# Patient Record
Sex: Male | Born: 1955 | Race: White | Hispanic: No | Marital: Married | Smoking: Former smoker
Health system: Southern US, Community
[De-identification: ages and names within clinical notes are randomized; demographics above are authoritative.]

## PROBLEM LIST (undated history)

## (undated) DIAGNOSIS — Z87442 Personal history of urinary calculi: Secondary | ICD-10-CM

## (undated) DIAGNOSIS — R7302 Impaired glucose tolerance (oral): Secondary | ICD-10-CM

## (undated) DIAGNOSIS — D126 Benign neoplasm of colon, unspecified: Secondary | ICD-10-CM

## (undated) DIAGNOSIS — K222 Esophageal obstruction: Secondary | ICD-10-CM

## (undated) DIAGNOSIS — K279 Peptic ulcer, site unspecified, unspecified as acute or chronic, without hemorrhage or perforation: Secondary | ICD-10-CM

## (undated) DIAGNOSIS — F528 Other sexual dysfunction not due to a substance or known physiological condition: Secondary | ICD-10-CM

## (undated) DIAGNOSIS — E785 Hyperlipidemia, unspecified: Secondary | ICD-10-CM

## (undated) DIAGNOSIS — K2270 Barrett's esophagus without dysplasia: Secondary | ICD-10-CM

## (undated) DIAGNOSIS — J309 Allergic rhinitis, unspecified: Secondary | ICD-10-CM

## (undated) HISTORY — DX: Barrett's esophagus without dysplasia: K22.70

## (undated) HISTORY — DX: Benign neoplasm of colon, unspecified: D12.6

## (undated) HISTORY — DX: Other sexual dysfunction not due to a substance or known physiological condition: F52.8

## (undated) HISTORY — DX: Peptic ulcer, site unspecified, unspecified as acute or chronic, without hemorrhage or perforation: K27.9

## (undated) HISTORY — DX: Allergic rhinitis, unspecified: J30.9

## (undated) HISTORY — DX: Personal history of urinary calculi: Z87.442

## (undated) HISTORY — PX: COLONOSCOPY: SHX174

## (undated) HISTORY — PX: SMALL INTESTINE SURGERY: SHX150

## (undated) HISTORY — DX: Hyperlipidemia, unspecified: E78.5

## (undated) HISTORY — PX: CHOLECYSTECTOMY: SHX55

## (undated) HISTORY — DX: Impaired glucose tolerance (oral): R73.02

## (undated) HISTORY — DX: Esophageal obstruction: K22.2

## (undated) HISTORY — PX: POLYPECTOMY: SHX149

---

## 2005-02-21 ENCOUNTER — Ambulatory Visit: Payer: Self-pay | Admitting: Internal Medicine

## 2005-03-06 DIAGNOSIS — D126 Benign neoplasm of colon, unspecified: Secondary | ICD-10-CM

## 2005-03-06 HISTORY — DX: Benign neoplasm of colon, unspecified: D12.6

## 2005-03-08 ENCOUNTER — Ambulatory Visit: Payer: Self-pay | Admitting: Internal Medicine

## 2005-04-20 ENCOUNTER — Ambulatory Visit: Payer: Self-pay | Admitting: Gastroenterology

## 2005-05-02 ENCOUNTER — Encounter (INDEPENDENT_AMBULATORY_CARE_PROVIDER_SITE_OTHER): Payer: Self-pay | Admitting: *Deleted

## 2005-05-02 ENCOUNTER — Ambulatory Visit: Payer: Self-pay | Admitting: Gastroenterology

## 2006-07-31 ENCOUNTER — Ambulatory Visit: Payer: Self-pay | Admitting: Internal Medicine

## 2006-07-31 LAB — CONVERTED CEMR LAB
ALT: 28 units/L (ref 0–40)
Basophils Absolute: 0 10*3/uL (ref 0.0–0.1)
Bilirubin Urine: NEGATIVE
Bilirubin, Direct: 0.1 mg/dL (ref 0.0–0.3)
Calcium: 8.9 mg/dL (ref 8.4–10.5)
Cholesterol: 157 mg/dL (ref 0–200)
Eosinophils Absolute: 0.2 10*3/uL (ref 0.0–0.6)
Eosinophils Relative: 1.9 % (ref 0.0–5.0)
GFR calc Af Amer: 101 mL/min
GFR calc non Af Amer: 83 mL/min
Glucose, Bld: 198 mg/dL — ABNORMAL HIGH (ref 70–99)
HDL: 43.6 mg/dL (ref >39.0)
Hemoglobin, Urine: NEGATIVE
LDL Cholesterol: 101 mg/dL — ABNORMAL HIGH (ref 0–99)
Lymphocytes Relative: 21.1 % (ref 12.0–46.0)
MCHC: 34.7 g/dL (ref 30.0–36.0)
MCV: 92.3 fL (ref 78.0–100.0)
Monocytes Relative: 6.9 % (ref 3.0–11.0)
Neutro Abs: 6.5 10*3/uL (ref 1.4–7.7)
PSA: 0.89 ng/mL (ref 0.10–4.00)
Platelets: 268 10*3/uL (ref 150–400)
Total CHOL/HDL Ratio: 3.6
Triglycerides: 64 mg/dL (ref 0–149)
Urine Glucose: NEGATIVE mg/dL
WBC: 9.2 10*3/uL (ref 4.5–10.5)

## 2006-08-09 ENCOUNTER — Ambulatory Visit: Payer: Self-pay | Admitting: Internal Medicine

## 2007-01-24 ENCOUNTER — Telehealth (INDEPENDENT_AMBULATORY_CARE_PROVIDER_SITE_OTHER): Payer: Self-pay | Admitting: *Deleted

## 2007-01-25 ENCOUNTER — Ambulatory Visit: Payer: Self-pay | Admitting: Internal Medicine

## 2007-01-25 DIAGNOSIS — J309 Allergic rhinitis, unspecified: Secondary | ICD-10-CM

## 2007-01-25 DIAGNOSIS — R7302 Impaired glucose tolerance (oral): Secondary | ICD-10-CM

## 2007-01-25 DIAGNOSIS — E785 Hyperlipidemia, unspecified: Secondary | ICD-10-CM | POA: Insufficient documentation

## 2007-01-25 DIAGNOSIS — R310 Gross hematuria: Secondary | ICD-10-CM | POA: Insufficient documentation

## 2007-01-25 DIAGNOSIS — F528 Other sexual dysfunction not due to a substance or known physiological condition: Secondary | ICD-10-CM | POA: Insufficient documentation

## 2007-01-25 DIAGNOSIS — Z860100 Personal history of colon polyps, unspecified: Secondary | ICD-10-CM | POA: Insufficient documentation

## 2007-01-25 HISTORY — DX: Impaired glucose tolerance (oral): R73.02

## 2007-01-25 HISTORY — DX: Hyperlipidemia, unspecified: E78.5

## 2007-01-25 HISTORY — DX: Other sexual dysfunction not due to a substance or known physiological condition: F52.8

## 2007-01-25 HISTORY — DX: Allergic rhinitis, unspecified: J30.9

## 2007-01-25 LAB — CONVERTED CEMR LAB
Bilirubin Urine: NEGATIVE
Glucose, Urine, Semiquant: NEGATIVE
Protein, U semiquant: NEGATIVE
pH: 6.5

## 2007-01-28 LAB — CONVERTED CEMR LAB
Bilirubin Urine: NEGATIVE
GFR calc non Af Amer: 94 mL/min
Potassium: 4.2 meq/L (ref 3.5–5.1)
Sodium: 141 meq/L (ref 135–145)
Specific Gravity, Urine: 1.02 (ref 1.000–1.03)
Total Protein, Urine: NEGATIVE mg/dL
Urine Glucose: NEGATIVE mg/dL
pH: 6 (ref 5.0–8.0)

## 2007-04-17 ENCOUNTER — Emergency Department (HOSPITAL_COMMUNITY): Admission: EM | Admit: 2007-04-17 | Discharge: 2007-04-17 | Payer: Self-pay | Admitting: Emergency Medicine

## 2007-04-17 ENCOUNTER — Telehealth (INDEPENDENT_AMBULATORY_CARE_PROVIDER_SITE_OTHER): Payer: Self-pay | Admitting: *Deleted

## 2007-06-06 ENCOUNTER — Encounter (INDEPENDENT_AMBULATORY_CARE_PROVIDER_SITE_OTHER): Payer: Self-pay | Admitting: General Surgery

## 2007-06-06 ENCOUNTER — Ambulatory Visit (HOSPITAL_COMMUNITY): Admission: RE | Admit: 2007-06-06 | Discharge: 2007-06-06 | Payer: Self-pay | Admitting: General Surgery

## 2007-07-05 ENCOUNTER — Encounter: Admission: RE | Admit: 2007-07-05 | Discharge: 2007-07-05 | Payer: Self-pay | Admitting: General Surgery

## 2008-06-16 ENCOUNTER — Ambulatory Visit: Payer: Self-pay | Admitting: Internal Medicine

## 2008-06-16 LAB — CONVERTED CEMR LAB
ALT: 28 units/L (ref 0–53)
AST: 24 units/L (ref 0–37)
Alkaline Phosphatase: 57 units/L (ref 39–117)
Basophils Absolute: 0.1 10*3/uL (ref 0.0–0.1)
Bilirubin Urine: NEGATIVE
Calcium: 8.8 mg/dL (ref 8.4–10.5)
Eosinophils Absolute: 0.5 10*3/uL (ref 0.0–0.7)
Eosinophils Relative: 8.4 % — ABNORMAL HIGH (ref 0.0–5.0)
GFR calc non Af Amer: 82.47 mL/min (ref >60)
Glucose, Bld: 110 mg/dL — ABNORMAL HIGH (ref 70–99)
HDL: 37.4 mg/dL — ABNORMAL LOW (ref >39.00)
Hemoglobin, Urine: NEGATIVE
Ketones, ur: NEGATIVE mg/dL
Leukocytes, UA: NEGATIVE
MCV: 91.1 fL (ref 78.0–100.0)
Monocytes Absolute: 0.7 10*3/uL (ref 0.1–1.0)
Neutrophils Relative %: 51.7 % (ref 43.0–77.0)
PSA: 0.62 ng/mL (ref 0.10–4.00)
Platelets: 218 10*3/uL (ref 150.0–400.0)
RDW: 12.5 % (ref 11.5–14.6)
Sodium: 144 meq/L (ref 135–145)
Specific Gravity, Urine: 1.01 (ref 1.000–1.030)
TSH: 1.52 microintl units/mL (ref 0.35–5.50)
Total Bilirubin: 0.6 mg/dL (ref 0.3–1.2)
Triglycerides: 71 mg/dL (ref 0.0–149.0)
Urine Glucose: NEGATIVE mg/dL
Urobilinogen, UA: 0.2 (ref 0.0–1.0)
WBC: 6.1 10*3/uL (ref 4.5–10.5)

## 2008-06-23 ENCOUNTER — Ambulatory Visit: Payer: Self-pay | Admitting: Internal Medicine

## 2008-06-23 DIAGNOSIS — Z87442 Personal history of urinary calculi: Secondary | ICD-10-CM

## 2008-06-23 DIAGNOSIS — J069 Acute upper respiratory infection, unspecified: Secondary | ICD-10-CM | POA: Insufficient documentation

## 2008-06-23 HISTORY — DX: Personal history of urinary calculi: Z87.442

## 2008-07-22 IMAGING — CT CT CHEST W/ CM
2 of 3 series · 15 of 36 positions shown, 18 images · IV contrast (75CC OMNI 300)
Comparison: None.  The chest x-ray in question has been reviewed.

CLINICAL DATA: Left lung nodules seen on a recent chest x-ray from
06/04/2007

CT CHEST WITH CONTRAST
TECHNIQUE: Multidetector CT imaging of the chest was performed
following the standard protocol during bolus administration of
intravenous contrast.
Contrast: 75 ml Tmnipaque-VUU

[Series 3: routine chest · axial · 0.70mm/px · z∈[-297,+13]mm · 12 of 74 slices shown, 15 images]
[im 6/74  mediastinal]
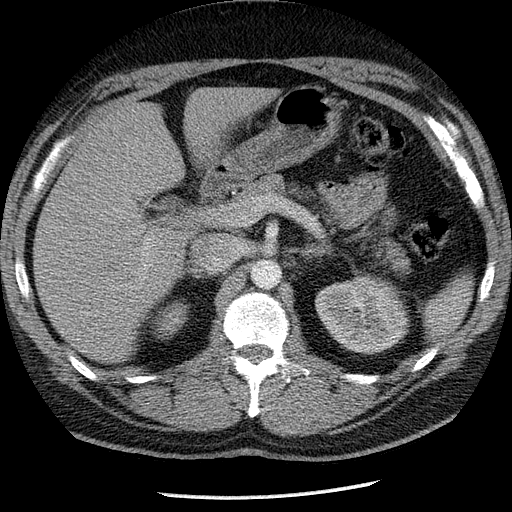
[im 6/74  lung]
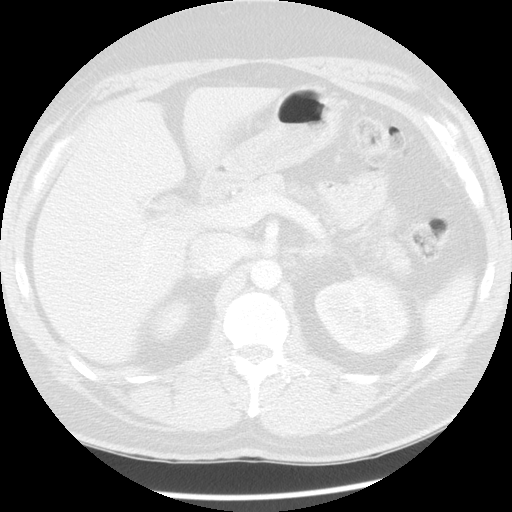
[im 11/74  lung]
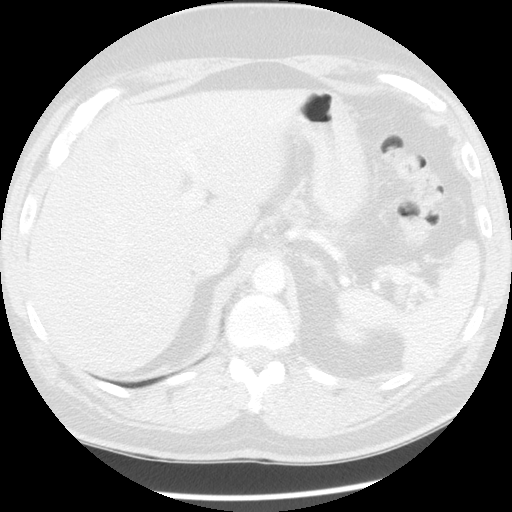
[im 17/74  lung]
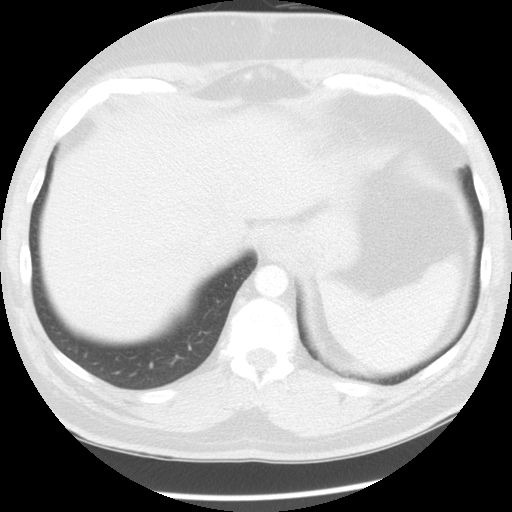
[im 22/74  lung]
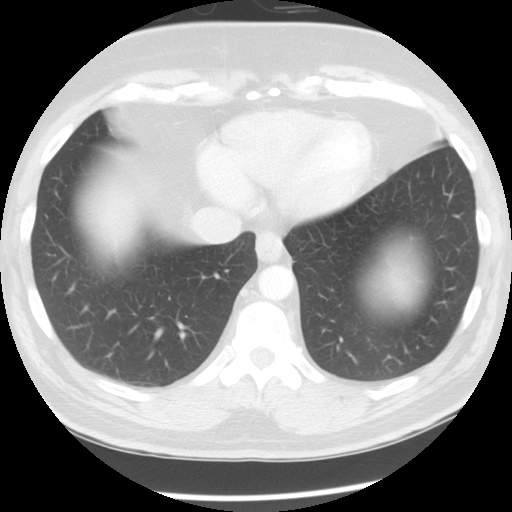
[im 28/74  mediastinal]
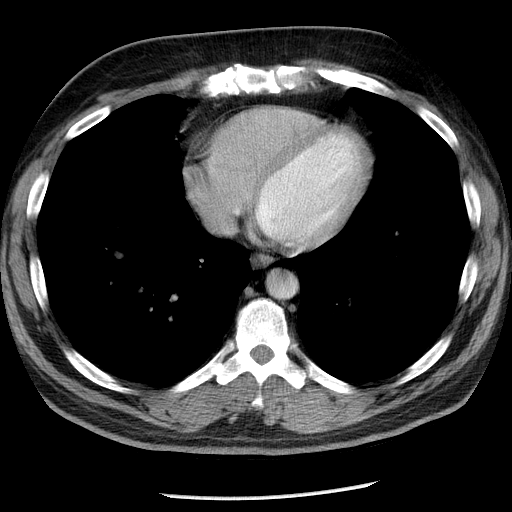
[im 28/74  lung]
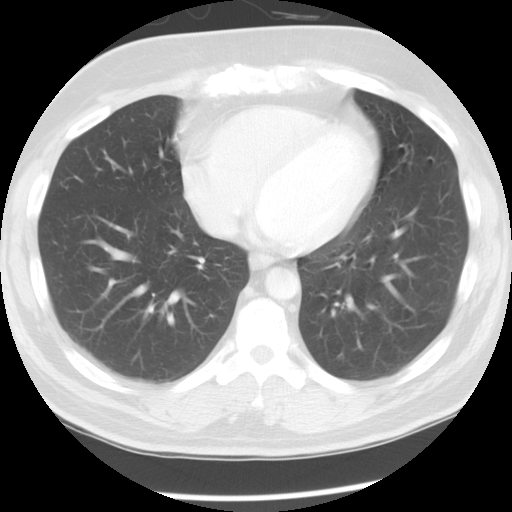
[im 33/74  lung]
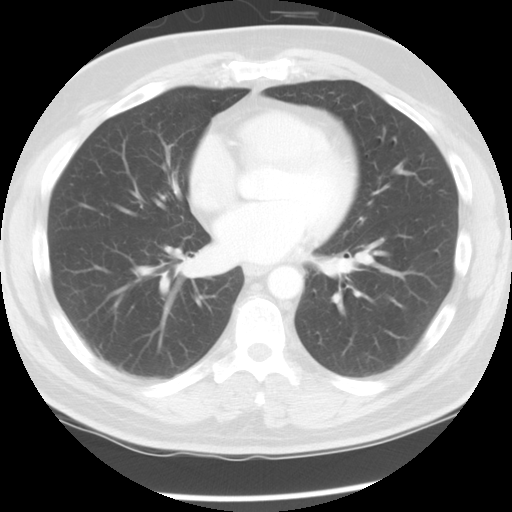
[im 41/74  lung]
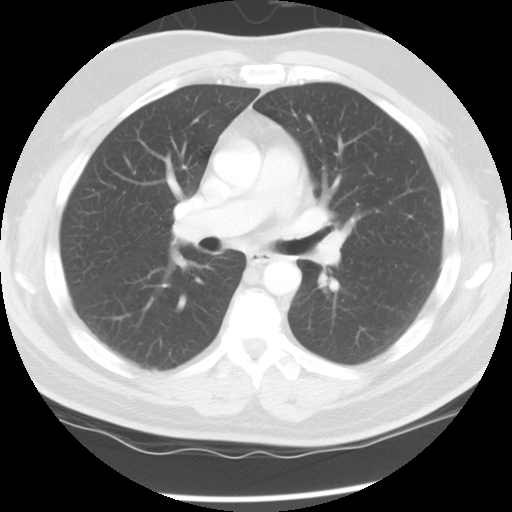
[im 46/74  lung]
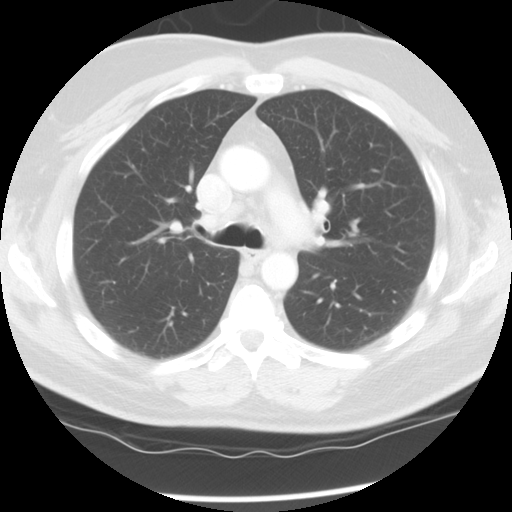
[im 52/74  mediastinal]
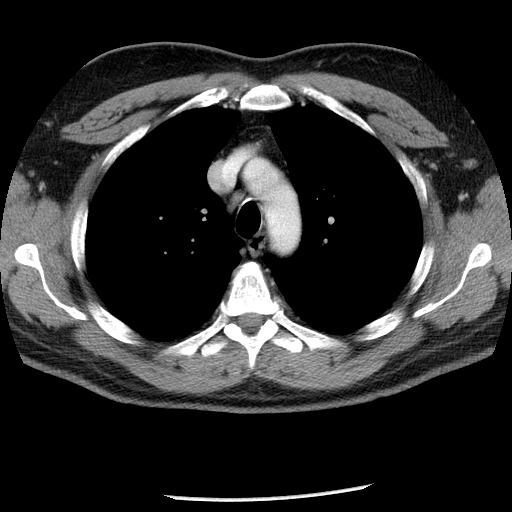
[im 52/74  lung]
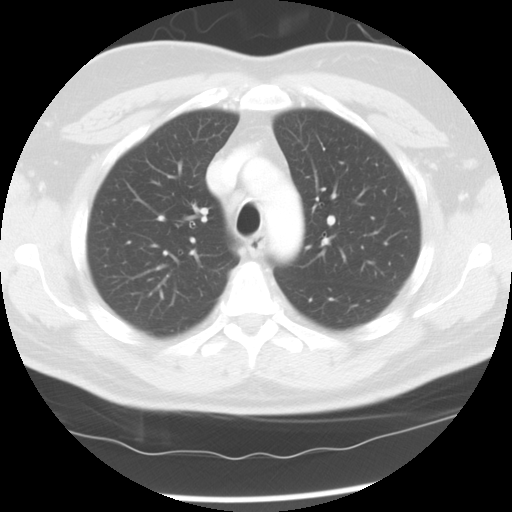
[im 57/74  lung]
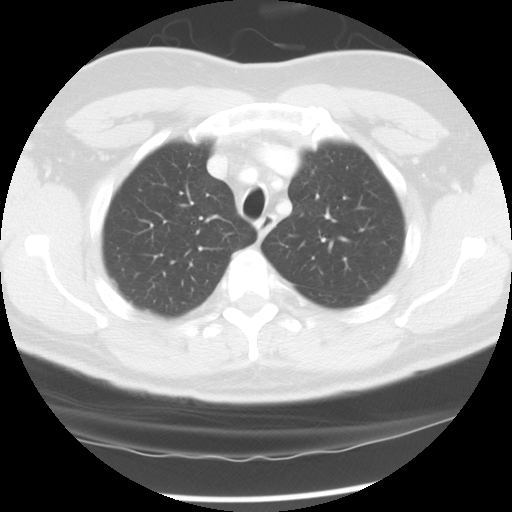
[im 63/74  lung]
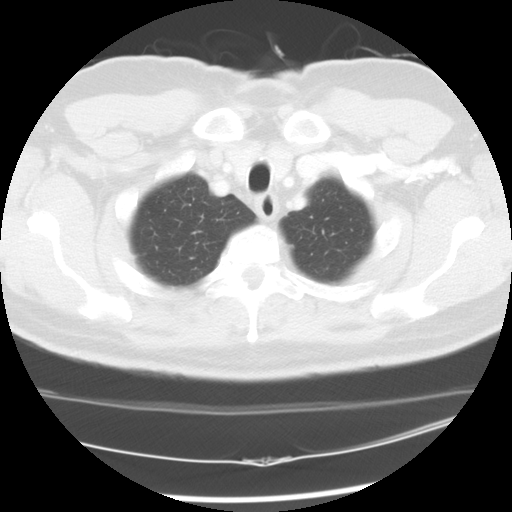
[im 68/74  lung]
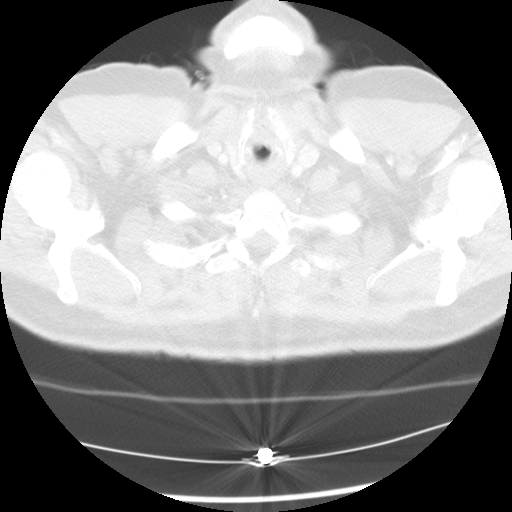

[Series 602: sagittal body · sagittal · 0.72mm/px · 3 of 145 slices shown]
[im 29/145  lung]
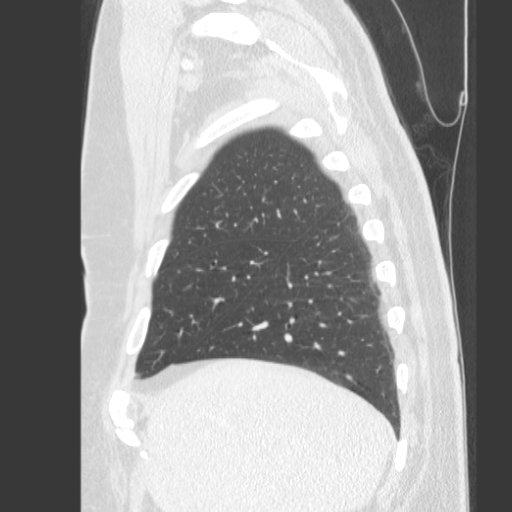
[im 58/145  lung]
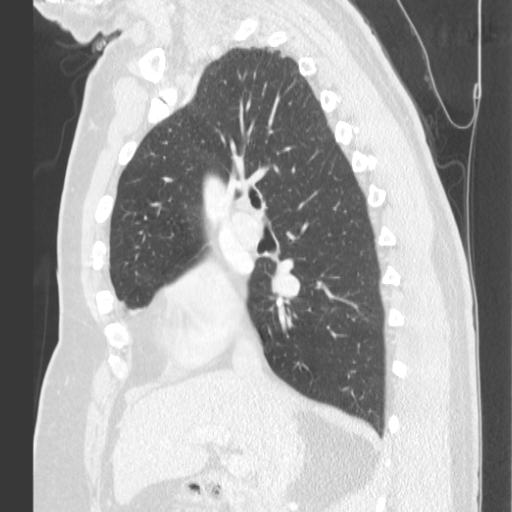
[im 87/145  lung]
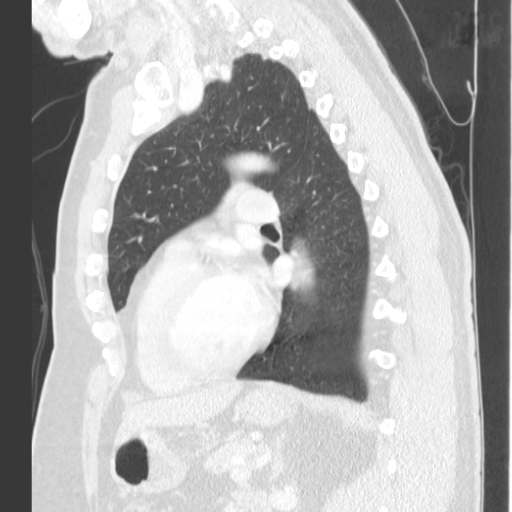

[15 of 36 positions shown; findings below may reference images not displayed]

FINDINGS: There is no axillary lymphadenopathy.  Thyroid gland is
unremarkable by CT.  The patient has some borderline enlarged lymph
nodes in the central mediastinum measuring up to 10 mm in short
axis in the paratracheal space and AP window.  No subcarinal or
hilar lymphadenopathy.  Heart size is normal.  There is no
pericardial or pleural effusion.

Lung windows demonstrate a 5 mm nodule in the right lower lobe
(image 33).  The left lung is clear.  Specifically, there is no
parenchymal abnormality at the location of the nodular densities
seen on the recent chest x-ray.
IMPRESSION: No evidence for pulmonary nodule in the left lung as suggested on
the recent chest x-ray.

The patient does have a tiny 5 mm nodular density in the right
lower lobe. In a patient with no history of smoking or malignancy,
followup CT chest without contrast in 6-12 months can be used to
assess stability.  In a high risk patient followup imaging in 3
months is recommended.

## 2009-03-30 ENCOUNTER — Encounter (INDEPENDENT_AMBULATORY_CARE_PROVIDER_SITE_OTHER): Payer: Self-pay | Admitting: *Deleted

## 2009-11-02 ENCOUNTER — Telehealth: Payer: Self-pay | Admitting: Gastroenterology

## 2010-03-27 ENCOUNTER — Encounter: Payer: Self-pay | Admitting: Internal Medicine

## 2010-04-05 NOTE — Progress Notes (Signed)
Summary: Schedule Colonoscopy  Phone Note Outgoing Call Call back at Home Phone [phone]   Call placed by: Harlow Mares CMA [facility]),  November 02, 2009 8:40 AM Call placed to: Patient Summary of Call: called patient to advise him he is due for his colonoscopy he states that he knows he got the letter but he has been out of work since December and still looking. I offered the patient assistance number but he states that he as a job interview this week and he hopes it works out. I made sure he has our phone number to call back to schedule when he gets a job and insurance, he told me he does have the number and will call back as soon as he can to schedule. Initial call taken by: Harlow Mares CMA [facility]),  November 02, 2009 8:42 AM

## 2010-04-05 NOTE — Letter (Signed)
Summary: Colonoscopy Letter  [facility]  [address] [place], [place] [address]   Phone: [phone]  Fax: [phone]      March 30, 2009 MRN: [id number]   [facility] [address] [place], [place]  [address]   Dear Louis Wheeler,   According to your medical record, it is time for you to schedule a Colonoscopy. The American Cancer Society recommends this procedure as a method to detect early colon cancer. Patients with a family history of colon cancer, or a personal history of colon polyps or inflammatory bowel disease are at increased risk.  This letter has beeen generated based on the recommendations made at the time of your procedure. If you feel that in your particular situation this may no longer apply, please contact our office.  Please call our office at [phone] to schedule this appointment or to update your records at your earliest convenience.  Thank you for cooperating with [place] to provide you with the very best care possible.   Sincerely,  Rachael Fee, M.D.  [facility] Gastroenterology Division [phone]

## 2010-07-19 NOTE — Op Note (Signed)
Louis Wheeler, Louis Wheeler NO.:  [id number]   MEDICAL RECORD NO.:  [id number]          PATIENT TYPE:  AMB   LOCATION:  SDS                          FACILITY:  [facility]   PHYSICIAN:  Ollen Gross. Vernell Morgans, M.D. DATE OF BIRTH:  1955-08-27   DATE OF PROCEDURE:  06/06/2007  DATE OF DISCHARGE:                               OPERATIVE REPORT   PREOPERATIVE DIAGNOSIS:  Gallstones.   POSTOPERATIVE DIAGNOSIS:  Gallstones.   PROCEDURE:  Laparoscopic cholecystectomy with intraoperative  cholangiogram.   SURGEON:  Ollen Gross. Vernell Morgans, MD   ASSISTANT:  Leonie Man, MD   ANESTHESIA:  General endotracheal.   PROCEDURE:  After informed consent was obtained, the patient was brought  to the operating room and placed in supine position on the operating  table.  After adding dose of general anesthesia the patient's abdomen  was prepped with Betadine, draped in the usual sterile manner.  The area  above the umbilicus was infiltrated with 0.25% Marcaine.   A small incision was made with a 15 blade knife.  This incision was  carried down through the skin and subcutaneous tissue bluntly with  hemostats and Army[employer] retractors, until the linea alba was identified.  The linea alba was incised with a 15 blade knife and each side was  grasped with Kocher clamps and elevated anteriorly.  The preperitoneal  space was probed bluntly with a hemostat until the peritoneum was opened  and access was gained to the abdominal cavity.  A 0 Vicryl pursestring  stitch was placed in the fascia surrounding the opening.  A sonic  cannula was placed through the opening and anchored in place with the  previously 0 Vicryl pursestring stitch.  The abdomen was insufflated  with carbon dioxide without difficulty.  A laparoscopic was inserted  through the sonic cannula.  At first it appeared as though we were under  the omentum.  We were subsequently able to drive around this omental  adhesion with the camera.  The  right upper quadrant was free.  The  epigastric region was then infiltrated with 0.25% Marcaine.  A small  incision was made with a 15 blade knife and the 10 mm port was placed  through this incision in the abdominal cavity under direct vision.  Sites were then chosen later on the right side of the abdomen for  placement of 5 mm ports.  Each of these areas was infiltrated with 0.25  Marcaine.  Small stab incisions were made with a 15 blade knife.  5 mm  ports were placed bluntly.  Through these incisions into the abdominal  cavity under direct vision, a blunt grasper was placed through the  lateral-most 5 mm port and used to grasp the dome of the gallbladder and  elevated anteriorly and superiorly.  Another blunt grasper was placed  through the other 5 mm port and used to retract all the body and neck of  the gallbladder.  Some omental adhesion to the body of the gallbladder  was taken down sharply with the electrocautery and the dissector.   Next  the dissector was placed through the epigastric port.  The  peritoneal reflection at the gallbladder neck was opened.  Blunt  dissection was then carried out in this area until the gallbladder neck  cystic duct junction was readily identified and a good window was  created.  A single clip was placed on the gallbladder neck.  A small  ductotomy was made just below the clip with the laparoscopic scissors.  A 14 gauge angiocath was placed percutaneously through the anterior  abdominal wall under direct vision.  A Reddick cholangiogram cath was  placed through the angiocath and flushed.  The Reddick catheter was then  placed in the cystic duct and anchored in place with the clip.  A  cholangiogram was obtained that showed no filling defects, good emptying  in the duodenum, and adequate length from the cystic duct.  The  anchoring clip and catheter was then removed from the patient.  Three  clips were placed proximally on the cystic duct and the duct  was divided  between the 2 sets of clips.  Posterior to this the cystic artery was  identified and again dissected bluntly in a circumferential manner until  a good window was created.  Two clips were placed proximally and 1  distally in the artery and the artery was divided between the two.  Next  the laparoscopic hook cautery device was used to separate the  gallbladder from the liver bed prior to completely detaching the  gallbladder from the liver bed.  The liver bed was inspected and several  small bleeding points were coagulated with the electrocautery until the  area was completely hemostatic.  The gallbladder was then detached the  rest of the way from the liver bed with right hook electrocautery  without difficulty.  A laparoscope bag was inserted through the  epigastric port.  The gallbladder was placed within the bag and the bag  was sealed.  The abdomen was then irrigated with copious amounts of  saline until the affluent was clear.  Prior to placing the [facility] down  through the supraumbilical port, we did have to take some small omental  adhesions down from around the entry site of the [place] cannula so that  we could access the abdominal cavity with that port more easily.  There  was no bowel that appeared adhesed to the anterior abdominal wall.   Next, the laparoscope was removed to the epigastric port.  A gallbladder  grasper was place through the [facility] cannula and used to grasp open the  bag.  The bag with the gallbladder was removed through the  supraumbilical port without difficulty.  The fascial defect was closed  with previously placed 0 Vicryl pursestring stitch, as well as with  another figure-of-eight 0 Vicryl stitch.  The rest of the ports were  removed under direct vision and were found to be hemostatic.  Gas was  allowed to escape.  The skin incisions were all closed with interrupted  4-0 Monocryl subcuticular stitches, and Dermabond dressings were  applied.   The patient tolerated the procedure well.   At the end of the case, all needle, sponge, and instrument counts were  correct.  The patient was then awakened, taken to recovery in stable  condition.      Ollen Gross. Vernell Morgans, M.D.  Electronically Signed     PST/MEDQ  D:  06/06/2007  T:  06/06/2007  Job:  [id number]

## 2010-11-25 LAB — URINALYSIS, ROUTINE W REFLEX MICROSCOPIC
Bilirubin Urine: NEGATIVE
Glucose, UA: NEGATIVE
Ketones, ur: NEGATIVE
Protein, ur: 100 — AB
pH: 6

## 2010-11-25 LAB — URINE MICROSCOPIC-ADD ON

## 2010-11-28 LAB — CBC
HCT: 45.3
MCV: 92.7
Platelets: 250
RBC: 4.89
WBC: 8.9

## 2010-11-28 LAB — COMPREHENSIVE METABOLIC PANEL
AST: 22
Albumin: 3.8
Alkaline Phosphatase: 67
BUN: 8
CO2: 30
Chloride: 106
Creatinine, Ser: 0.99
GFR calc Af Amer: >60
GFR calc non Af Amer: >60
Potassium: 3.5
Total Bilirubin: 0.5

## 2010-11-28 LAB — DIFFERENTIAL
Basophils Absolute: 0
Basophils Relative: 0
Eosinophils Relative: 5
Lymphocytes Relative: 25
Monocytes Absolute: 0.8

## 2012-12-17 ENCOUNTER — Telehealth: Payer: Self-pay | Admitting: Internal Medicine

## 2012-12-17 NOTE — Telephone Encounter (Signed)
Ok with me 

## 2012-12-17 NOTE — Telephone Encounter (Signed)
Pt used to see Dr. Jonny Ruiz 5 or more years ago, moved away and has moved back.  Will you take him back as a patient?  He has Medical sales representative.

## 2012-12-17 NOTE — Telephone Encounter (Signed)
LMOM to call for an appt. °

## 2012-12-18 NOTE — Telephone Encounter (Signed)
Pt called back and set up an appt for Nov 12.

## 2013-01-15 ENCOUNTER — Other Ambulatory Visit (INDEPENDENT_AMBULATORY_CARE_PROVIDER_SITE_OTHER): Payer: 59

## 2013-01-15 ENCOUNTER — Encounter: Payer: Self-pay | Admitting: Internal Medicine

## 2013-01-15 ENCOUNTER — Ambulatory Visit (INDEPENDENT_AMBULATORY_CARE_PROVIDER_SITE_OTHER): Payer: 59 | Admitting: Internal Medicine

## 2013-01-15 VITALS — BP 130/72 | HR 76 | Temp 98.1°F | Ht 70.0 in | Wt 249.1 lb

## 2013-01-15 DIAGNOSIS — R7302 Impaired glucose tolerance (oral): Secondary | ICD-10-CM

## 2013-01-15 DIAGNOSIS — R7309 Other abnormal glucose: Secondary | ICD-10-CM

## 2013-01-15 DIAGNOSIS — Z0000 Encounter for general adult medical examination without abnormal findings: Secondary | ICD-10-CM

## 2013-01-15 DIAGNOSIS — Z860100 Personal history of colon polyps, unspecified: Secondary | ICD-10-CM

## 2013-01-15 LAB — BASIC METABOLIC PANEL
CO2: 33 mEq/L — ABNORMAL HIGH (ref 19–32)
Chloride: 103 mEq/L (ref 96–112)
Glucose, Bld: 110 mg/dL — ABNORMAL HIGH (ref 70–99)
Potassium: 3.7 mEq/L (ref 3.5–5.1)
Sodium: 140 mEq/L (ref 135–145)

## 2013-01-15 LAB — CBC WITH DIFFERENTIAL/PLATELET
Basophils Absolute: 0 10*3/uL (ref 0.0–0.1)
Eosinophils Absolute: 0.4 10*3/uL (ref 0.0–0.7)
Hemoglobin: 14.8 g/dL (ref 13.0–17.0)
Lymphocytes Relative: 27.1 % (ref 12.0–46.0)
MCHC: 34.2 g/dL (ref 30.0–36.0)
Monocytes Relative: 10.3 % (ref 3.0–12.0)
Neutro Abs: 4.5 10*3/uL (ref 1.4–7.7)
Neutrophils Relative %: 57.4 % (ref 43.0–77.0)
Platelets: 257 10*3/uL (ref 150.0–400.0)
RDW: 13.8 % (ref 11.5–14.6)

## 2013-01-15 LAB — LIPID PANEL
Cholesterol: 136 mg/dL (ref 0–200)
VLDL: 24 mg/dL (ref 0.0–40.0)

## 2013-01-15 LAB — HEPATIC FUNCTION PANEL
ALT: 23 U/L (ref 0–53)
AST: 21 U/L (ref 0–37)
Albumin: 4.1 g/dL (ref 3.5–5.2)
Alkaline Phosphatase: 59 U/L (ref 39–117)
Bilirubin, Direct: 0.1 mg/dL (ref 0.0–0.3)
Total Protein: 6.6 g/dL (ref 6.0–8.3)

## 2013-01-15 LAB — URINALYSIS, ROUTINE W REFLEX MICROSCOPIC
Hgb urine dipstick: NEGATIVE
Ketones, ur: NEGATIVE
Leukocytes, UA: NEGATIVE
Urine Glucose: NEGATIVE
Urobilinogen, UA: 0.2 (ref 0.0–1.0)

## 2013-01-15 LAB — HEMOGLOBIN A1C: Hgb A1c MFr Bld: 5.6 % (ref 4.6–6.5)

## 2013-01-15 LAB — TSH: TSH: 1.3 u[IU]/mL (ref 0.35–5.50)

## 2013-01-15 NOTE — Assessment & Plan Note (Signed)
stable overall by history and exam, recent data reviewed with pt, and pt to continue medical treatment as before,  to f/u any worsening symptoms or concerns, for a1c 

## 2013-01-15 NOTE — Progress Notes (Signed)
Subjective:    Patient ID: Louis Wheeler, male    DOB: 20-Sep-1955, 57 y.o.   MRN: [id number]  HPI  Here for wellness and to re-stablish  Overall doing ok;  Pt denies CP, worsening SOB, DOE, wheezing, orthopnea, PND, worsening LE edema, palpitations, dizziness or syncope.  Pt denies neurological change such as new headache, facial or extremity weakness.  Pt denies polydipsia, polyuria, or low sugar symptoms. Pt states overall good compliance with treatment and medications, good tolerability, and has been trying to follow lower cholesterol diet.  Pt denies worsening depressive symptoms, suicidal ideation or panic. No fever, night sweats, wt loss, loss of appetite, or other constitutional symptoms.  Pt states good ability with ADL's, has low fall risk, home safety reviewed and adequate, no other significant changes in hearing or vision, and only occasionally active with exercise. Has been successful in the past working for [employer], now has lawn care business on the [employer], also doing well Past Medical History  Diagnosis Date  . Impaired glucose tolerance 01/25/2007    Qualifier: Diagnosis of  By: Jonny Ruiz MD, Len Blalock   . HYPERLIPIDEMIA 01/25/2007    Qualifier: Diagnosis of  By: Jonny Ruiz MD, Len Blalock   . ALLERGIC RHINITIS 01/25/2007    Qualifier: Diagnosis of  By: Jonny Ruiz MD, Len Blalock   . ERECTILE DYSFUNCTION 01/25/2007    Qualifier: Diagnosis of  By: Jonny Ruiz MD, Len Blalock   . COLONIC POLYPS, HX OF 01/25/2007    Qualifier: Diagnosis of  By: Jonny Ruiz MD, Len Blalock   . 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  reports that he has quit smoking. He does not have any smokeless tobacco history on file. He reports that he drinks alcohol. He reports that he does not use illicit drugs. family history includes Hypertension in an other family member. No Known Allergies No current outpatient prescriptions on file prior to visit.   No current  facility-administered medications on file prior to visit.   Quit smoking x 5 yrs.  Due for f/u colonoscopy  Review of Systems Constitutional: Negative for diaphoresis, activity change, appetite change or unexpected weight change.  HENT: Negative for hearing loss, ear pain, facial swelling, mouth sores and neck stiffness.   Eyes: Negative for pain, redness and visual disturbance.  Respiratory: Negative for shortness of breath and wheezing.   Cardiovascular: Negative for chest pain and palpitations.  Gastrointestinal: Negative for diarrhea, blood in stool, abdominal distention or other pain Genitourinary: Negative for hematuria, flank pain or change in urine volume.  Musculoskeletal: Negative for myalgias and joint swelling.  Skin: Negative for color change and wound.  Neurological: Negative for syncope and numbness. other than noted Hematological: Negative for adenopathy.  Psychiatric/Behavioral: Negative for hallucinations, self-injury, decreased concentration and agitation.      Objective:   Physical Exam BP 130/72  Pulse 76  Temp(Src) 98.1 F (36.7 C) (Oral)  Ht 5\' 10"  (1.778 m)  Wt 249 lb 2 oz (113.002 kg)  BMI 35.75 kg/m2  SpO2 95% VS noted,  Constitutional: Pt is oriented to person, place, and time. Appears well-developed and well-nourished.  Head: Normocephalic and atraumatic.  Right Ear: External ear normal.  Left Ear: External ear normal.  Nose: Nose normal.  Mouth/Throat: Oropharynx is clear and moist.  Eyes: Conjunctivae and EOM are normal. Pupils are equal, round, and reactive to light.  Neck: Normal range of  motion. Neck supple. No JVD present. No tracheal deviation present.  Cardiovascular: Normal rate, regular rhythm, normal heart sounds and intact distal pulses.   Pulmonary/Chest: Effort normal and breath sounds normal.  Abdominal: Soft. Bowel sounds are normal. There is no tenderness. No HSM  Musculoskeletal: Normal range of motion. Exhibits no edema.   Lymphadenopathy:  Has no cervical adenopathy.  Neurological: Pt is alert and oriented to person, place, and time. Pt has normal reflexes. No cranial nerve deficit.  Skin: Skin is warm and dry. No rash noted.  Psychiatric:  Has  normal mood and affect. Behavior is normal.     Assessment & Plan:

## 2013-01-15 NOTE — Assessment & Plan Note (Signed)

## 2013-01-15 NOTE — Patient Instructions (Addendum)
Please start Aspirin 81 mg  - 1 per day (enteric coated only)  Please continue all other medications as before  Please have the pharmacy call with any other refills you may need.  Please continue your efforts at being more active, low cholesterol diet, and weight control.  You are otherwise up to date with prevention measures today.  You will be contacted regarding the referral for: colonoscopy  Please go to the LAB in the Basement (turn left off the elevator) for the tests to be done today  You will be contacted by phone if any changes need to be made immediately.  Otherwise, you will receive a letter about your results with an explanation, but please check with MyChart first.  Please remember to sign up for My Chart if you have not done so, as this will be important to you in the future with finding out test results, communicating by private email, and scheduling acute appointments online when needed.  Please return in 1 year for your yearly visit, or sooner if needed, with Lab testing done 3-5 days before

## 2013-03-13 ENCOUNTER — Encounter: Payer: Self-pay | Admitting: Internal Medicine

## 2014-09-30 ENCOUNTER — Encounter: Payer: Self-pay | Admitting: Internal Medicine

## 2014-09-30 ENCOUNTER — Ambulatory Visit (INDEPENDENT_AMBULATORY_CARE_PROVIDER_SITE_OTHER): Payer: 59 | Admitting: Internal Medicine

## 2014-09-30 ENCOUNTER — Other Ambulatory Visit (INDEPENDENT_AMBULATORY_CARE_PROVIDER_SITE_OTHER): Payer: 59

## 2014-09-30 VITALS — BP 122/72 | HR 76 | Temp 97.8°F | Wt 247.8 lb

## 2014-09-30 DIAGNOSIS — R7302 Impaired glucose tolerance (oral): Secondary | ICD-10-CM

## 2014-09-30 DIAGNOSIS — Z0000 Encounter for general adult medical examination without abnormal findings: Secondary | ICD-10-CM | POA: Diagnosis not present

## 2014-09-30 LAB — HEPATIC FUNCTION PANEL
ALK PHOS: 66 U/L (ref 39–117)
ALT: 24 U/L (ref 0–53)
AST: 21 U/L (ref 0–37)
Albumin: 4.3 g/dL (ref 3.5–5.2)
Bilirubin, Direct: 0.1 mg/dL (ref 0.0–0.3)
TOTAL PROTEIN: 6.6 g/dL (ref 6.0–8.3)
Total Bilirubin: 0.5 mg/dL (ref 0.2–1.2)

## 2014-09-30 LAB — URINALYSIS, ROUTINE W REFLEX MICROSCOPIC
Bilirubin Urine: NEGATIVE
Hgb urine dipstick: NEGATIVE
Ketones, ur: NEGATIVE
Leukocytes, UA: NEGATIVE
Nitrite: NEGATIVE
PH: 6 (ref 5.0–8.0)
RBC / HPF: NONE SEEN (ref 0–?)
TOTAL PROTEIN, URINE-UPE24: NEGATIVE
UROBILINOGEN UA: 0.2 (ref 0.0–1.0)
Urine Glucose: NEGATIVE

## 2014-09-30 LAB — BASIC METABOLIC PANEL
BUN: 15 mg/dL (ref 6–23)
CALCIUM: 9.4 mg/dL (ref 8.4–10.5)
CO2: 28 mEq/L (ref 19–32)
Chloride: 107 mEq/L (ref 96–112)
Creatinine, Ser: 1.1 mg/dL (ref 0.40–1.50)
GFR: 72.27 mL/min (ref >60.00)
Glucose, Bld: 94 mg/dL (ref 70–99)
POTASSIUM: 3.9 meq/L (ref 3.5–5.1)
SODIUM: 141 meq/L (ref 135–145)

## 2014-09-30 LAB — CBC WITH DIFFERENTIAL/PLATELET
BASOS PCT: 0.6 % (ref 0.0–3.0)
Basophils Absolute: 0.1 10*3/uL (ref 0.0–0.1)
Eosinophils Absolute: 0.3 10*3/uL (ref 0.0–0.7)
Eosinophils Relative: 3.9 % (ref 0.0–5.0)
HEMATOCRIT: 44.7 % (ref 39.0–52.0)
HEMOGLOBIN: 15.2 g/dL (ref 13.0–17.0)
Lymphocytes Relative: 22 % (ref 12.0–46.0)
Lymphs Abs: 1.9 10*3/uL (ref 0.7–4.0)
MCHC: 34 g/dL (ref 30.0–36.0)
MCV: 90.9 fl (ref 78.0–100.0)
MONO ABS: 0.8 10*3/uL (ref 0.1–1.0)
Monocytes Relative: 9.2 % (ref 3.0–12.0)
NEUTROS ABS: 5.5 10*3/uL (ref 1.4–7.7)
NEUTROS PCT: 64.3 % (ref 43.0–77.0)
PLATELETS: 256 10*3/uL (ref 150.0–400.0)
RBC: 4.92 Mil/uL (ref 4.22–5.81)
RDW: 14 % (ref 11.5–15.5)
WBC: 8.5 10*3/uL (ref 4.0–10.5)

## 2014-09-30 LAB — PSA: PSA: 0.72 ng/mL (ref 0.10–4.00)

## 2014-09-30 LAB — LIPID PANEL
Cholesterol: 143 mg/dL (ref 0–200)
HDL: 39.9 mg/dL (ref >39.00)
LDL Cholesterol: 71 mg/dL (ref 0–99)
NonHDL: 103.1
Total CHOL/HDL Ratio: 4
Triglycerides: 162 mg/dL — ABNORMAL HIGH (ref 0.0–149.0)
VLDL: 32.4 mg/dL (ref 0.0–40.0)

## 2014-09-30 LAB — TSH: TSH: 1.21 u[IU]/mL (ref 0.35–4.50)

## 2014-09-30 LAB — HEMOGLOBIN A1C: Hgb A1c MFr Bld: 5.4 % (ref 4.6–6.5)

## 2014-09-30 MED ORDER — ASPIRIN EC 81 MG PO TBEC: 81.0000 mg | DELAYED_RELEASE_TABLET | Freq: Every day | ORAL | Status: AC

## 2014-09-30 NOTE — Progress Notes (Signed)
Subjective:    Patient ID: Louis Wheeler, male    DOB: Jun 15, 1955, 59 y.o.   MRN: [id number]  HPI  Here for wellness and f/u;  Overall doing ok;  Pt denies Chest pain, worsening SOB, DOE, wheezing, orthopnea, PND, worsening LE edema, palpitations, dizziness or syncope.  Pt denies neurological change such as new headache, facial or extremity weakness.  Pt denies polydipsia, polyuria, or low sugar symptoms. Pt states overall good compliance with treatment and medications, good tolerability, and has been trying to follow appropriate diet.  Pt denies worsening depressive symptoms, suicidal ideation or panic. No fever, night sweats, wt loss, loss of appetite, or other constitutional symptoms.  Pt states good ability with ADL's, has low fall risk, home safety reviewed and adequate, no other significant changes in hearing or vision, and only occasionally active with exercise. No current complaints Past Medical History  Diagnosis Date  . Impaired glucose tolerance 01/25/2007    Qualifier: Diagnosis of  By: Jenny Reichmann MD, Hunt Oris   . HYPERLIPIDEMIA 01/25/2007    Qualifier: Diagnosis of  By: Jenny Reichmann MD, Hunt Oris   . ALLERGIC RHINITIS 01/25/2007    Qualifier: Diagnosis of  By: Jenny Reichmann MD, [place] ERECTILE DYSFUNCTION 01/25/2007    Qualifier: Diagnosis of  By: Jenny Reichmann MD, [place], HX OF 01/25/2007    Qualifier: Diagnosis of  By: Jenny Reichmann MD, Hunt Oris   . 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 No pertinent past surgical history.  reports that he has quit smoking. He does not have any smokeless tobacco history on file. He reports that he drinks alcohol. He reports that he does not use illicit drugs. family history includes Hypertension in an other family member. No Known Allergies No current outpatient prescriptions on file prior to visit.   No current facility-administered medications on file prior to visit.   Review of  Systems Constitutional: Negative for increased diaphoresis, other activity, appetite or siginficant weight change other than noted HENT: Negative for worsening hearing loss, ear pain, facial swelling, mouth sores and neck stiffness.   Eyes: Negative for other worsening pain, redness or visual disturbance.  Respiratory: Negative for shortness of breath and wheezing  Cardiovascular: Negative for chest pain and palpitations.  Gastrointestinal: Negative for diarrhea, blood in stool, abdominal distention or other pain Genitourinary: Negative for hematuria, flank pain or change in urine volume.  Musculoskeletal: Negative for myalgias or other joint complaints.  Skin: Negative for color change and wound or drainage.  Neurological: Negative for syncope and numbness. other than noted Hematological: Negative for adenopathy. or other swelling Psychiatric/Behavioral: Negative for hallucinations, SI, self-injury, decreased concentration or other worsening agitation.      Objective:   Physical Exam BP 122/72 mmHg  Pulse 76  Temp(Src) 97.8 F (36.6 C)  Wt 247 lb 12 oz (112.379 kg)  SpO2 96% VS noted,  Constitutional: Pt is oriented to person, place, and time. Appears well-developed and well-nourished, in no significant distress Head: Normocephalic and atraumatic.  Right Ear: External ear normal.  Left Ear: External ear normal.  Nose: Nose normal.  Mouth/Throat: Oropharynx is clear and moist.  Eyes: Conjunctivae and EOM are normal. Pupils are equal, round, and reactive to light.  Neck: Normal range of motion. Neck supple. No JVD present. No tracheal deviation present or significant neck LA or mass Cardiovascular: Normal rate, regular rhythm, normal heart sounds  and intact distal pulses.   Pulmonary/Chest: Effort normal and breath sounds without rales or wheezing  Abdominal: Soft. Bowel sounds are normal. NT. No HSM  Musculoskeletal: Normal range of motion. Exhibits no edema.  Lymphadenopathy:   Has no cervical adenopathy.  Neurological: Pt is alert and oriented to person, place, and time. Pt has normal reflexes. No cranial nerve deficit. Motor grossly intact Skin: Skin is warm and dry. No rash noted.  Psychiatric:  Has normal mood and affect. Behavior is normal.      Assessment & Plan:

## 2014-09-30 NOTE — Progress Notes (Signed)
Pre visit review using our clinic review tool, if applicable. No additional management support is needed unless otherwise documented below in the visit note. 

## 2014-09-30 NOTE — Patient Instructions (Addendum)

## 2014-09-30 NOTE — Assessment & Plan Note (Signed)
Asympt, for a1c today 

## 2014-09-30 NOTE — Assessment & Plan Note (Signed)

## 2014-10-28 ENCOUNTER — Encounter: Payer: Self-pay | Admitting: Physician Assistant

## 2014-11-17 ENCOUNTER — Encounter: Payer: Self-pay | Admitting: Physician Assistant

## 2014-11-17 ENCOUNTER — Ambulatory Visit (INDEPENDENT_AMBULATORY_CARE_PROVIDER_SITE_OTHER): Payer: 59 | Admitting: Physician Assistant

## 2014-11-17 VITALS — BP 124/70 | HR 68 | Ht 70.0 in | Wt 246.4 lb

## 2014-11-17 DIAGNOSIS — K225 Diverticulum of esophagus, acquired: Secondary | ICD-10-CM | POA: Diagnosis not present

## 2014-11-17 DIAGNOSIS — R1310 Dysphagia, unspecified: Secondary | ICD-10-CM | POA: Diagnosis not present

## 2014-11-17 DIAGNOSIS — Z860100 Personal history of colon polyps, unspecified: Secondary | ICD-10-CM

## 2014-11-17 NOTE — Progress Notes (Signed)
Patient ID: Louis Wheeler, male   DOB: 1955-08-16, 59 y.o.   MRN: [id number]    HPI:  Louis Wheeler is a 59 y.o.   male  referred by Louis Borg, MD for evaluation of dysphagia. Louis Wheeler has been seen in the past by Dr. Rachelle Wheeler. He had a colonoscopy on 05/02/2005 at which time a 4 mm sessile polyp was removed from the transverse colon. A 3 mm sessile polyp was removed from the descending colon. A 3 mm sessile polyp was removed from the transverse colon. A 3 mm polyp was removed from the descending colon. A 5 mm sessile polyp was removed from the sigmoid colon. He was also noted to have internal and external hemorrhoids. Pathology revealed 2 tubular adenomas with no high-grade dysplasia or malignancy identified. He was advised to have surveillance in 4 years but did not do so. He has had no change in his bowel habits or stool caliber, he's had no bloody or tarry stools, he's had no anorexia or unexplained weight loss.  He reports that 3-1/2-4 weeks ago while visiting [place] he was eating a piece of steak when a piece of meat became lodged. He states he felt as it was stuck in the upper esophagus that he could breathe but he couldn't get anything else to go down and he could not get it to come out. Upon returning home he was evaluated by ENT in [facility], and had a flexible laryngoscopy that showed an esophageal impaction. The piece of food was able to be dislodged. The patient was then sent for a barium esophagram that revealed a small Zenker's diverticulum. A small esophageal ulcer versus diverticulum in the upper thoracic esophagus. Sliding hiatal hernia with mild reflux. Standard eyes barium tablet passed normally. No foreign body or obstruction. Given the above findings, endoscopy should be considered for further evaluation. Patient states he very rarely has heartburn. He does feel as if solid foods become lodged in the efferent esophagus. He denies coughing or sputtering while eating. He has no  dysphagia to liquids. He rarely drinks alcohol. He quit smoking 20 years ago. There is no family history of esophageal cancer.   Past Medical History  Diagnosis Date  . Impaired glucose tolerance 01/25/2007    Qualifier: Diagnosis of  By: Louis Reichmann MD, Hunt Oris   . HYPERLIPIDEMIA 01/25/2007    Qualifier: Diagnosis of  By: Louis Reichmann MD, Hunt Oris   . ALLERGIC RHINITIS 01/25/2007    Qualifier: Diagnosis of  By: Louis Reichmann MD, [place] ERECTILE DYSFUNCTION 01/25/2007    Qualifier: Diagnosis of  By: Louis Reichmann MD, [place], HX OF 01/25/2007    Qualifier: Diagnosis of  By: Louis Reichmann MD, Hunt Oris   . NEPHROLITHIASIS, HX OF 06/23/2008    Qualifier: Diagnosis of  By: Louis Reichmann MD, Hunt Oris     Past Surgical History  Procedure Laterality Date  . Cholecystectomy    . Small intestine surgery      "twisted intestines"   Family History  Problem Relation Age of Onset  . Hypertension    . Colon cancer Neg Hx   . Colon polyps Neg Hx   . Esophageal cancer Neg Hx   . Pancreatic cancer Neg Hx   . Leukemia Brother   . Diabetes Neg Hx   . Heart disease Neg Hx   . Kidney disease Neg Hx   . Liver disease Neg Hx    Social History  Substance Use Topics  . Smoking status: Former Research scientist (life sciences)  . Smokeless tobacco: Never Used  . Alcohol Use: 0.0 oz/week    0 Standard drinks or equivalent per week     Comment: socially   Current Outpatient Prescriptions  Medication Sig Dispense Refill  . aspirin EC 81 MG tablet Take 1 tablet (81 mg total) by mouth daily. (Patient taking differently: Take 81 mg by mouth as needed. ) 90 tablet 11   No current facility-administered medications for this visit.   No Known Allergies   Review of Systems: Gen: Denies any fever, chills, sweats, anorexia, fatigue, weakness, malaise, weight loss, and sleep disorder CV: Denies chest pain, angina, palpitations, syncope, orthopnea, PND, peripheral edema, and claudication. Resp: Denies dyspnea at rest, dyspnea with exercise, cough, sputum,  wheezing, coughing up blood, and pleurisy. GI: Denies vomiting blood, jaundice, and fecal incontinence.   Has dysphagia to solids. GU : Denies urinary burning, blood in urine, urinary frequency, urinary hesitancy, nocturnal urination, and urinary incontinence. MS: Denies joint pain, limitation of movement, and swelling, stiffness, low back pain, extremity pain. Denies muscle weakness, cramps, atrophy.  Derm: Denies rash, itching, dry skin, hives, moles, warts, or unhealing ulcers.  Psych: Denies depression, anxiety, memory loss, suicidal ideation, hallucinations, paranoia, and confusion. Heme: Denies bruising, bleeding, and enlarged lymph nodes. Neuro:  Denies any headaches, dizziness, paresthesias. Endo:  Denies any problems with DM, thyroid, adrenal function  Studies: Barium swallow done on 10/27/2014 showed  #1 small Zenker's diverticulum #2 small esophageal ulcer versus diverticulum upper thoracic esophagus #3 sliding hiatal hernia with mild reflux. Standard eyes barium tablet passed normally. No foreign body or obstruction. Given the above findings endoscopy should be considered for further evaluation.    Prior Endoscopies:  See history of present illness  Physical Exam: BP 124/70 mmHg  Pulse 68  Ht 5\' 10"  (1.778 m)  Wt 246 lb 6.4 oz (111.766 kg)  BMI 35.35 kg/m2 Constitutional: Pleasant,well-developed, male in no acute distress. HEENT: Normocephalic and atraumatic. Conjunctivae are normal. No scleral icterus. Neck supple. No JVD Cardiovascular: Normal rate, regular rhythm.  Pulmonary/chest: Effort normal and breath sounds normal. No wheezing, rales or rhonchi. Abdominal: Soft, nondistended, nontender. Bowel sounds active throughout. There are no masses palpable. No hepatomegaly. Extremities: no edema Lymphadenopathy: No cervical adenopathy noted. Neurological: Alert and oriented to person place and time. Skin: Skin is warm and dry. No rashes noted. Psychiatric: Normal mood  and affect. Behavior is normal.  ASSESSMENT AND PLAN: 59 year old male with a 3 to four-week history of dysphagia, referred for evaluation. Patient has had a laryngoscopy with ENT with removal of a small piece of impacted food. Subsequent barium swallow revealed a small Zenker's diverticulum and small esophageal ulcer versus diverticulum upper thoracic esophagus. Sliding hiatal hernia with mild reflux. Patient also has a history of adenomatous colon polyps and is overdue for surveillance. He will be scheduled for an EGD to evaluate for esophagitis, ulcer, stricture, etc., as well as a colonoscopy to evaluate for recurrent polyps or neoplasia.The risks, benefits, and alternatives to endoscopy with possible biopsy and possible dilation were discussed with the patient and they consent to proceed.  The risks, benefits, and alternatives to colonoscopy with possible biopsy and possible polypectomy were discussed with the patient and they consent to proceed.  The procedures will be scheduled with Dr. Fuller Plan. Further recommendations will be made pending the findings of the above.    Delmont Prosch, Deloris Ping 11/17/2014, 11:27 AM  CC: Louis Borg, MD

## 2014-11-17 NOTE — Patient Instructions (Signed)

## 2014-11-17 NOTE — Progress Notes (Signed)
Reviewed and agree with management plan.  Logan Vegh T. Anyela Napierkowski, MD FACG 

## 2014-11-19 ENCOUNTER — Other Ambulatory Visit: Payer: Self-pay

## 2014-11-19 DIAGNOSIS — Z86010 Personal history of colonic polyps: Secondary | ICD-10-CM

## 2014-11-19 DIAGNOSIS — K225 Diverticulum of esophagus, acquired: Secondary | ICD-10-CM

## 2014-11-19 DIAGNOSIS — R1310 Dysphagia, unspecified: Secondary | ICD-10-CM

## 2015-01-04 ENCOUNTER — Telehealth: Payer: Self-pay | Admitting: Physician Assistant

## 2015-01-04 MED ORDER — NA SULFATE-K SULFATE-MG SULF 17.5-3.13-1.6 GM/177ML PO SOLN: 1.0000 | Freq: Once | ORAL | Status: DC

## 2015-01-04 NOTE — Telephone Encounter (Signed)
Sent prescription to [facility] for [employer] today 01-04-2015.

## 2015-01-05 ENCOUNTER — Ambulatory Visit (AMBULATORY_SURGERY_CENTER): Payer: 59 | Admitting: Gastroenterology

## 2015-01-05 ENCOUNTER — Encounter: Payer: Self-pay | Admitting: Gastroenterology

## 2015-01-05 VITALS — BP 154/74 | HR 51 | Temp 98.0°F | Resp 12 | Ht 70.0 in | Wt 246.0 lb

## 2015-01-05 DIAGNOSIS — K222 Esophageal obstruction: Secondary | ICD-10-CM

## 2015-01-05 DIAGNOSIS — R933 Abnormal findings on diagnostic imaging of other parts of digestive tract: Secondary | ICD-10-CM | POA: Diagnosis not present

## 2015-01-05 DIAGNOSIS — K209 Esophagitis, unspecified without bleeding: Secondary | ICD-10-CM

## 2015-01-05 DIAGNOSIS — K635 Polyp of colon: Secondary | ICD-10-CM

## 2015-01-05 DIAGNOSIS — K2270 Barrett's esophagus without dysplasia: Secondary | ICD-10-CM | POA: Diagnosis not present

## 2015-01-05 DIAGNOSIS — R1310 Dysphagia, unspecified: Secondary | ICD-10-CM

## 2015-01-05 DIAGNOSIS — Z86010 Personal history of colonic polyps: Secondary | ICD-10-CM | POA: Diagnosis not present

## 2015-01-05 DIAGNOSIS — D125 Benign neoplasm of sigmoid colon: Secondary | ICD-10-CM | POA: Diagnosis not present

## 2015-01-05 MED ORDER — SODIUM CHLORIDE 0.9 % IV SOLN: 500.0000 mL | INTRAVENOUS | Status: DC

## 2015-01-05 MED ORDER — OMEPRAZOLE 40 MG PO CPDR: 40.0000 mg | DELAYED_RELEASE_CAPSULE | Freq: Two times a day (BID) | ORAL | Status: DC

## 2015-01-05 NOTE — Patient Instructions (Addendum)
CALL AND MAKE AN APPOINTMENT IN 4-6 WEEKS, [id number].  ANTICIPATE A REPEAT ENDOSCOPY IN 3 MONTHS.   YOU HAD AN ENDOSCOPIC PROCEDURE TODAY AT [facility]:   Refer to the procedure report that was given to you for any specific questions about what was found during the examination.  If the procedure report does not answer your questions, please call your gastroenterologist to clarify.  If you requested that your care partner not be given the details of your procedure findings, then the procedure report has been included in a sealed envelope for you to review at your convenience later.  YOU SHOULD EXPECT: Some feelings of bloating in the abdomen. Passage of more gas than usual.  Walking can help get rid of the air that was put into your GI tract during the procedure and reduce the bloating. If you had a lower endoscopy (such as a colonoscopy or flexible sigmoidoscopy) you may notice spotting of blood in your stool or on the toilet paper. If you underwent a bowel prep for your procedure, you may not have a normal bowel movement for a few days.  Please Note:  You might notice some irritation and congestion in your nose or some drainage.  This is from the oxygen used during your procedure.  There is no need for concern and it should clear up in a day or so.  SYMPTOMS TO REPORT IMMEDIATELY:   Following lower endoscopy (colonoscopy or flexible sigmoidoscopy):  Excessive amounts of blood in the stool  Significant tenderness or worsening of abdominal pains  Swelling of the abdomen that is new, acute  Fever of 100F or higher   Following upper endoscopy (EGD)  Vomiting of blood or coffee ground material  New chest pain or pain under the shoulder blades  Painful or persistently difficult swallowing  New shortness of breath  Fever of 100F or higher  Black, tarry-looking stools  For urgent or emergent issues, a gastroenterologist can be reached at any hour by calling [phone]  [phone].   DIET:  NOTHING TO EAT OR DRINK UNTIL 4 PM 4 PM UNTIL 5 PM ONLY CLEAR LIQUIDS. AFTER 5 PM ONLY SOFT FOODS. RESUME YOUR REGULAR DIET IN AM  ACTIVITY:  You should plan to take it easy for the rest of today and you should NOT DRIVE or use heavy machinery until tomorrow (because of the sedation medicines used during the test).    FOLLOW UP: Our staff will call the number listed on your records the next business day following your procedure to check on you and address any questions or concerns that you may have regarding the information given to you following your procedure. If we do not reach you, we will leave a message.  However, if you are feeling well and you are not experiencing any problems, there is no need to return our call.  We will assume that you have returned to your regular daily activities without incident.  If any biopsies were taken you will be contacted by phone or by letter within the next 1-3 weeks.  Please call us at [phone] if you have not heard about the biopsies in 3 weeks.    SIGNATURES/CONFIDENTIALITY: You and/or your care partner have signed paperwork which will be entered into your electronic medical record.  These signatures attest to the fact that that the information above on your After Visit Summary has been reviewed and is understood.  Full responsibility of the confidentiality of this discharge information lies with  you and/or your care-partner.

## 2015-01-05 NOTE — Progress Notes (Signed)
Report to PACU, RN, vss, BBS= Clear.  

## 2015-01-05 NOTE — Op Note (Signed)
[place]  [address] [place], [address]   ENDOSCOPY PROCEDURE REPORT  PATIENT: Louis Wheeler, Louis Wheeler  MR#: [id number] BIRTHDATE: 09/17/1955 , 65  yrs. old GENDER: male ENDOSCOPIST: Ladene Artist, MD, [facility] REFERRED BY:  Cathlean Cower, M.D. PROCEDURE DATE:  01/05/2015 PROCEDURE:  EGD w/ wire guided (savary) dilation and EGD w/ biopsy  ASA CLASS:     Class II INDICATIONS:  dysphagia and abnormal barium esophagogram. MEDICATIONS: Monitored anesthesia care, Residual sedation present, and Propofol 100 mg IV TOPICAL ANESTHETIC: none DESCRIPTION OF PROCEDURE: After the risks benefits and alternatives of the procedure were thoroughly explained, informed consent was obtained.  The LB [id number] endoscope was introduced through the mouth and advanced to the second portion of the duodenum , Without limitations.  The instrument was slowly withdrawn as the mucosa was fully examined.  ESOPHAGUS: There was LA Class C esophagitis (Mucosal breaks continuous between > 2 mucosal folds, but involving less than 75% of the esophageal circumference) noted.   A shallow erosion was found in the upper third of the esophagus.   Inlet patch in upper third of the esophagus.   Multiple biopsies were performed in the distal esophagus-R/O Barrett's. Stricture at the EGJ.  The stricture was dilated using a 72mm, 15mm and 73mm (42Fr) savary dilator over guidewire.  Mnimal resistance and small heme with each dilator. STOMACH: The mucosa of the stomach appeared normal. DUODENUM: The duodenal mucosa showed no abnormalities in the bulb and 2nd part of the duodenum.  Retroflexed views revealed a small hiatal hernia.   The scope was then withdrawn from the patient and the procedure completed.  COMPLICATIONS: There were no immediate complications.  ENDOSCOPIC IMPRESSION: 1.   LA Class C esophagitis 2.   Erosion in the upper third of the esophagus 3.   Inlet patch in upper third of the  esophagus 4.   Stricture at the EGJ; dilated using savary dilators over guidewire 6.   Small hiatal hernia  RECOMMENDATIONS: 1.  Anti-reflux regimen long term 2.  Await pathology results 3.  PPI bid: omeprazole 40 mg po bid, 1 year of refills 4.  Endoscopy in 3 months to evaluate healing of esophagitis, upper esophageal erosion 5.  Office appt in 4-6 weeks  eSigned:  Ladene Artist, MD, [facility] 01/05/2015 2:55 PM

## 2015-01-05 NOTE — Progress Notes (Signed)
Called to room to assist during endoscopic procedure.  Patient ID and intended procedure confirmed with present staff. Received instructions for my participation in the procedure from the performing physician.  

## 2015-01-05 NOTE — Op Note (Signed)
[place]  [address]. [place], [address]   COLONOSCOPY PROCEDURE REPORT  PATIENT: Louis Wheeler, Louis Wheeler  MR#: [id number] BIRTHDATE: [birth year] , 28  yrs. old GENDER: male ENDOSCOPIST: Ladene Artist, MD, [facility] REFERRED PP:IRJJO Gerry, M.D. PROCEDURE DATE:  01/05/2015 PROCEDURE:   Colonoscopy, surveillance and Colonoscopy with snare polypectomy First Screening Colonoscopy - Avg.  risk and is 50 yrs.  old or older - No.  Prior Negative Screening - Now for repeat screening. N/A  History of Adenoma - Now for follow-up colonoscopy & has been > or = to 3 yrs.  Yes hx of adenoma.  Has been 3 or more years since last colonoscopy.  Polyps removed today? Yes ASA CLASS:   Class II INDICATIONS:Surveillance due to prior colonic neoplasia and PH Colon Adenoma. MEDICATIONS: Monitored anesthesia care and Propofol 200 mg IV DESCRIPTION OF PROCEDURE:   After the risks benefits and alternatives of the procedure were thoroughly explained, informed consent was obtained.  The digital rectal exam revealed no abnormalities of the rectum.   The LB [id number]  endoscope was introduced through the anus and advanced to the cecum, which was identified by both the appendix and ileocecal valve. No adverse events experienced.   The quality of the prep was good.  (Suprep was used)  The instrument was then slowly withdrawn as the colon was fully examined. Estimated blood loss is zero unless otherwise noted in this procedure report.    COLON FINDINGS: Four sessile polyps measuring 4-5 mm in size were found in the sigmoid colon.  Polypectomies were performed with a cold snare.  The resection was complete, the polyp tissue was completely retrieved and sent to histology. There was mild diverticulosis noted in the sigmoid colon. The examination was otherwise normal. Retroflexed views revealed internal Grade I hemorrhoids. The time to cecum = 1.8 Withdrawal time = 10.8   The scope was withdrawn  and the procedure completed. COMPLICATIONS: There were no immediate complications.  ENDOSCOPIC IMPRESSION: 1.   Four sessile polyps in the sigmoid colon; polypectomies performed with a cold snare 2.   Mild diverticulosis in the sigmoid colon 3.   Grade l internal hemorrhoids  RECOMMENDATIONS: 1.  Await pathology results 2.  High fiber diet with liberal fluid intake. 3.  Repeat Colonoscopy in 5 years.  eSigned:  Ladene Artist, MD, [facility] 01/05/2015 2:39 PM

## 2015-01-06 ENCOUNTER — Telehealth: Payer: Self-pay

## 2015-01-06 NOTE — Telephone Encounter (Signed)
Contact number given 01-05-15 invalid. Attempt home number also, no answer or vm to leave message.

## 2015-01-11 ENCOUNTER — Encounter: Payer: Self-pay | Admitting: Gastroenterology

## 2015-02-15 ENCOUNTER — Ambulatory Visit (INDEPENDENT_AMBULATORY_CARE_PROVIDER_SITE_OTHER): Payer: 59 | Admitting: Gastroenterology

## 2015-02-15 ENCOUNTER — Encounter: Payer: Self-pay | Admitting: Gastroenterology

## 2015-02-15 VITALS — BP 114/60 | HR 72 | Ht 70.0 in | Wt 240.0 lb

## 2015-02-15 DIAGNOSIS — K2270 Barrett's esophagus without dysplasia: Secondary | ICD-10-CM | POA: Diagnosis not present

## 2015-02-15 DIAGNOSIS — K2100 Gastro-esophageal reflux disease with esophagitis, without bleeding: Secondary | ICD-10-CM

## 2015-02-15 NOTE — Patient Instructions (Signed)
You have been scheduled for an endoscopy. Please follow written instructions given to you at your visit today. If you use inhalers (even only as needed), please bring them with you on the day of your procedure. Your physician has requested that you go to www.startemmi.com and enter the access code given to you at your visit today. This web site gives a general overview about your procedure. However, you should still follow specific instructions given to you by our office regarding your preparation for the procedure. Continue your PPI twice daily.

## 2015-02-15 NOTE — Progress Notes (Signed)
    History of Present Illness: This is a 59 year old male returning for follow-up of Barrett's esophagus, esophageal stricture and LA class C erosive esophagitis. In addition there was a proximal esophageal erosion noted. No dysplasia was noted on biopsies. He has been maintained on pantoprazole 40 mg twice daily and his reflux symptoms are under excellent control. His dysphagia completely resolved following dilation  Current Medications, Allergies, Past Medical History, Past Surgical History, Family History and Social History were reviewed in [employer] record.  Physical Exam: General: Well developed, well nourished, no acute distress Head: Normocephalic and atraumatic Eyes:  sclerae anicteric, EOMI Ears: Normal auditory acuity Mouth: No deformity or lesions Lungs: Clear throughout to auscultation Heart: Regular rate and rhythm; no murmurs, rubs or bruits Abdomen: Soft, non tender and non distended. No masses, hepatosplenomegaly or hernias noted. Normal Bowel sounds Musculoskeletal: Symmetrical with no gross deformities  Pulses:  Normal pulses noted Extremities: No clubbing, cyanosis, edema or deformities noted Neurological: Alert oriented x 4, grossly nonfocal Psychological:  Alert and cooperative. Normal mood and affect  Assessment and Recommendations:  1. Barretts esophagus, LA class C erosive esophagitis, esophageal stricture post dilation and a proximal esophageal erosion. Continue standard antireflux measures and pantoprazole 40 mg twice daily. EGD to assess healing and obtain repeat biopsies for Barrett's in 04/2015. The risks (including bleeding, perforation, infection, missed lesions, medication reactions and possible hospitalization or surgery if complications occur), benefits, and alternatives to endoscopy with possible biopsy and possible dilation were discussed with the patient and they consent to proceed.   2. CRC screening screening average risk. 10  year colonoscopy recommended November 2026.

## 2015-04-01 ENCOUNTER — Telehealth: Payer: Self-pay | Admitting: Internal Medicine

## 2015-04-08 ENCOUNTER — Encounter: Payer: 59 | Admitting: Gastroenterology

## 2015-04-08 ENCOUNTER — Encounter: Payer: Self-pay | Admitting: Gastroenterology

## 2015-04-08 ENCOUNTER — Ambulatory Visit (AMBULATORY_SURGERY_CENTER): Payer: 59 | Admitting: Gastroenterology

## 2015-04-08 VITALS — BP 133/70 | HR 55 | Temp 95.9°F | Resp 14 | Ht 70.0 in | Wt 240.0 lb

## 2015-04-08 DIAGNOSIS — K2270 Barrett's esophagus without dysplasia: Secondary | ICD-10-CM | POA: Diagnosis present

## 2015-04-08 DIAGNOSIS — K2900 Acute gastritis without bleeding: Secondary | ICD-10-CM | POA: Diagnosis not present

## 2015-04-08 DIAGNOSIS — K3189 Other diseases of stomach and duodenum: Secondary | ICD-10-CM | POA: Diagnosis not present

## 2015-04-08 DIAGNOSIS — K208 Other esophagitis: Secondary | ICD-10-CM | POA: Diagnosis not present

## 2015-04-08 DIAGNOSIS — K2960 Other gastritis without bleeding: Secondary | ICD-10-CM

## 2015-04-08 MED ORDER — SODIUM CHLORIDE 0.9 % IV SOLN: 500.0000 mL | INTRAVENOUS | Status: DC

## 2015-04-08 NOTE — Op Note (Signed)
[place]  [address]. [place], [address]   ENDOSCOPY PROCEDURE REPORT  PATIENT: Louis, Wheeler  MR#: [id number] BIRTHDATE: 11-03-1955 , 22  yrs. old GENDER: male ENDOSCOPIST: Ladene Artist, MD, [facility] PROCEDURE DATE:  04/08/2015 PROCEDURE:  EGD w/ biopsy ASA CLASS:     Class II INDICATIONS:  follow up of Barrett's esophagus. MEDICATIONS: Monitored anesthesia care and Propofol 200 mg IV TOPICAL ANESTHETIC: none DESCRIPTION OF PROCEDURE: After the risks benefits and alternatives of the procedure were thoroughly explained, informed consent was obtained.  The LB [id number] endoscope was introduced through the mouth and advanced to the second portion of the duodenum , Without limitations.  The instrument was slowly withdrawn as the mucosa was fully examined.    ESOPHAGUS: There was a 2cm segment of Barrett's esophagus without dysplasia found in the distal esophagus.  Multiple biopsies were performed. A small inlet patch in the proximal esophagus. Previous esophagitis has healed. STOMACH: Moderate erosive gastritis was found in the gastric antrum. Multiple biopsies were performed.   The stomach otherwise appeared normal. DUODENUM: The duodenal mucosa showed no abnormalities in the bulb and 2nd part of the duodenum.  Retroflexed views revealed a small hiatal hernia.     The scope was then withdrawn from the patient and the procedure completed.  COMPLICATIONS: There were no immediate complications.  ENDOSCOPIC IMPRESSION: 1.   Barrett's esophagus in the distal esophagus; multiple biopsies performed 2.   Erosive gastritis in the gastric antrum; multiple biopsies performed 3.   Small hiatal hernia  RECOMMENDATIONS: 1.  Anti-reflux regimen 2.  Await pathology results 3.  Continue PPI bid long term 4.  Minimize/avoid ASA/NSAIDs 5.  EGD in 3 years if no dysplasia 6.  REV in 1 year   [R eSigned:  Ladene Artist, MD, [facility] 04/08/2015 10:57  AM

## 2015-04-08 NOTE — Progress Notes (Signed)
Report to PACU, RN, vss, BBS= Clear.  

## 2015-04-08 NOTE — Progress Notes (Signed)
Called to room to assist during endoscopic procedure.  Patient ID and intended procedure confirmed with present staff. Received instructions for my participation in the procedure from the performing physician.  

## 2015-04-08 NOTE — Patient Instructions (Signed)
YOU HAD AN ENDOSCOPIC PROCEDURE TODAY AT [facility]:   Refer to the procedure report that was given to you for any specific questions about what was found during the examination.  If the procedure report does not answer your questions, please call your gastroenterologist to clarify.  If you requested that your care partner not be given the details of your procedure findings, then the procedure report has been included in a sealed envelope for you to review at your convenience later.  YOU SHOULD EXPECT: Some feelings of bloating in the abdomen. Passage of more gas than usual.  Walking can help get rid of the air that was put into your GI tract during the procedure and reduce the bloating. If you had a lower endoscopy (such as a colonoscopy or flexible sigmoidoscopy) you may notice spotting of blood in your stool or on the toilet paper. If you underwent a bowel prep for your procedure, you may not have a normal bowel movement for a few days.  Please Note:  You might notice some irritation and congestion in your nose or some drainage.  This is from the oxygen used during your procedure.  There is no need for concern and it should clear up in a day or so.  SYMPTOMS TO REPORT IMMEDIATELY:   Following upper endoscopy (EGD)  Vomiting of blood or coffee ground material  New chest pain or pain under the shoulder blades  Painful or persistently difficult swallowing  New shortness of breath  Fever of 100F or higher  Black, tarry-looking stools  For urgent or emergent issues, a gastroenterologist can be reached at any hour by calling [phone].   DIET: Your first meal following the procedure should be a small meal and then it is ok to progress to your normal diet. Heavy or fried foods are harder to digest and may make you feel nauseous or bloated.  Likewise, meals heavy in dairy and vegetables can increase bloating.  Drink plenty of fluids but you should avoid alcoholic beverages for  24 hours.  ACTIVITY:  You should plan to take it easy for the rest of today and you should NOT DRIVE or use heavy machinery until tomorrow (because of the sedation medicines used during the test).    FOLLOW UP: Our staff will call the number listed on your records the next business day following your procedure to check on you and address any questions or concerns that you may have regarding the information given to you following your procedure. If we do not reach you, we will leave a message.  However, if you are feeling well and you are not experiencing any problems, there is no need to return our call.  We will assume that you have returned to your regular daily activities without incident.  If any biopsies were taken you will be contacted by phone or by letter within the next 1-3 weeks.  Please call us at [phone] if you have not heard about the biopsies in 3 weeks.    SIGNATURES/CONFIDENTIALITY: You and/or your care partner have signed paperwork which will be entered into your electronic medical record.  These signatures attest to the fact that that the information above on your After Visit Summary has been reviewed and is understood.  Full responsibility of the confidentiality of this discharge information lies with you and/or your care-partner.  Please review gastritis and hiatal hernia handouts provided. Minimize/avoid NSAID's (aspirin, aspirin products, and anti-inflammatory drugs). Await biopsy results.

## 2015-04-09 ENCOUNTER — Telehealth: Payer: Self-pay | Admitting: *Deleted

## 2015-04-09 NOTE — Telephone Encounter (Signed)
  Follow up Call-  Call back number 04/08/2015 01/05/2015  Post procedure Call Back phone  # [phone]  Permission to leave phone message Yes Yes     Patient questions:  Do you have a fever, pain , or abdominal swelling? No. Pain Score  0 *  Have you tolerated food without any problems? Yes.    Have you been able to return to your normal activities? Yes.    Do you have any questions about your discharge instructions: Diet   No. Medications  No. Follow up visit  No.  Do you have questions or concerns about your Care? No.  Actions: * If pain score is 4 or above: No action needed, pain <4.

## 2015-04-19 ENCOUNTER — Encounter: Payer: Self-pay | Admitting: Gastroenterology

## 2015-10-01 ENCOUNTER — Encounter: Payer: 59 | Admitting: Internal Medicine

## 2015-12-28 ENCOUNTER — Ambulatory Visit: Payer: 59 | Admitting: Internal Medicine

## 2015-12-29 ENCOUNTER — Other Ambulatory Visit (INDEPENDENT_AMBULATORY_CARE_PROVIDER_SITE_OTHER): Payer: BLUE CROSS/BLUE SHIELD

## 2015-12-29 ENCOUNTER — Encounter: Payer: Self-pay | Admitting: Internal Medicine

## 2015-12-29 ENCOUNTER — Ambulatory Visit (INDEPENDENT_AMBULATORY_CARE_PROVIDER_SITE_OTHER): Payer: BLUE CROSS/BLUE SHIELD | Admitting: Internal Medicine

## 2015-12-29 VITALS — BP 130/72 | HR 76 | Resp 20 | Wt 254.0 lb

## 2015-12-29 DIAGNOSIS — R21 Rash and other nonspecific skin eruption: Secondary | ICD-10-CM

## 2015-12-29 DIAGNOSIS — K209 Esophagitis, unspecified without bleeding: Secondary | ICD-10-CM

## 2015-12-29 DIAGNOSIS — Z1159 Encounter for screening for other viral diseases: Secondary | ICD-10-CM

## 2015-12-29 DIAGNOSIS — R7302 Impaired glucose tolerance (oral): Secondary | ICD-10-CM

## 2015-12-29 DIAGNOSIS — R350 Frequency of micturition: Secondary | ICD-10-CM | POA: Insufficient documentation

## 2015-12-29 DIAGNOSIS — Z0001 Encounter for general adult medical examination with abnormal findings: Secondary | ICD-10-CM

## 2015-12-29 DIAGNOSIS — D125 Benign neoplasm of sigmoid colon: Secondary | ICD-10-CM

## 2015-12-29 LAB — HEPATIC FUNCTION PANEL
ALBUMIN: 4.3 g/dL (ref 3.5–5.2)
ALK PHOS: 62 U/L (ref 39–117)
ALT: 28 U/L (ref 0–53)
AST: 19 U/L (ref 0–37)
BILIRUBIN TOTAL: 0.5 mg/dL (ref 0.2–1.2)
Bilirubin, Direct: 0.1 mg/dL (ref 0.0–0.3)
Total Protein: 6.6 g/dL (ref 6.0–8.3)

## 2015-12-29 LAB — CBC WITH DIFFERENTIAL/PLATELET
BASOS PCT: 0.3 % (ref 0.0–3.0)
Basophils Absolute: 0 10*3/uL (ref 0.0–0.1)
EOS PCT: 1.7 % (ref 0.0–5.0)
Eosinophils Absolute: 0.1 10*3/uL (ref 0.0–0.7)
HCT: 45.2 % (ref 39.0–52.0)
HEMOGLOBIN: 15.5 g/dL (ref 13.0–17.0)
Lymphocytes Relative: 19.3 % (ref 12.0–46.0)
Lymphs Abs: 1.6 10*3/uL (ref 0.7–4.0)
MCHC: 34.3 g/dL (ref 30.0–36.0)
MCV: 90.1 fl (ref 78.0–100.0)
MONOS PCT: 10.7 % (ref 3.0–12.0)
Monocytes Absolute: 0.9 10*3/uL (ref 0.1–1.0)
Neutro Abs: 5.7 10*3/uL (ref 1.4–7.7)
Neutrophils Relative %: 68 % (ref 43.0–77.0)
Platelets: 237 10*3/uL (ref 150.0–400.0)
RBC: 5.02 Mil/uL (ref 4.22–5.81)
RDW: 13.8 % (ref 11.5–15.5)
WBC: 8.4 10*3/uL (ref 4.0–10.5)

## 2015-12-29 LAB — URINALYSIS, ROUTINE W REFLEX MICROSCOPIC
Bilirubin Urine: NEGATIVE
HGB URINE DIPSTICK: NEGATIVE
Ketones, ur: NEGATIVE
LEUKOCYTES UA: NEGATIVE
NITRITE: NEGATIVE
RBC / HPF: NONE SEEN (ref 0–?)
Specific Gravity, Urine: 1.02 (ref 1.000–1.030)
Total Protein, Urine: NEGATIVE
Urine Glucose: NEGATIVE
Urobilinogen, UA: 0.2 (ref 0.0–1.0)
pH: 6 (ref 5.0–8.0)

## 2015-12-29 LAB — BASIC METABOLIC PANEL
BUN: 21 mg/dL (ref 6–23)
CALCIUM: 9.5 mg/dL (ref 8.4–10.5)
CO2: 30 mEq/L (ref 19–32)
Chloride: 106 mEq/L (ref 96–112)
Creatinine, Ser: 1.05 mg/dL (ref 0.40–1.50)
GFR: 75.95 mL/min (ref >60.00)
GLUCOSE: 100 mg/dL — AB (ref 70–99)
POTASSIUM: 3.9 meq/L (ref 3.5–5.1)
SODIUM: 142 meq/L (ref 135–145)

## 2015-12-29 LAB — LIPID PANEL
CHOL/HDL RATIO: 3
CHOLESTEROL: 134 mg/dL (ref 0–200)
HDL: 47.1 mg/dL (ref >39.00)
LDL CALC: 66 mg/dL (ref 0–99)
NonHDL: 86.93
Triglycerides: 106 mg/dL (ref 0.0–149.0)
VLDL: 21.2 mg/dL (ref 0.0–40.0)

## 2015-12-29 LAB — PSA: PSA: 0.91 ng/mL (ref 0.10–4.00)

## 2015-12-29 LAB — TSH: TSH: 1.38 u[IU]/mL (ref 0.35–4.50)

## 2015-12-29 LAB — HEMOGLOBIN A1C: Hgb A1c MFr Bld: 5.7 % (ref 4.6–6.5)

## 2015-12-29 MED ORDER — TAMSULOSIN HCL 0.4 MG PO CAPS: 0.4000 mg | ORAL_CAPSULE | Freq: Every day | ORAL | 11 refills | Status: DC

## 2015-12-29 MED ORDER — FLUOCINONIDE-E 0.05 % EX CREA: 1.0000 "application " | TOPICAL_CREAM | Freq: Two times a day (BID) | CUTANEOUS | 1 refills | Status: DC

## 2015-12-29 NOTE — Patient Instructions (Signed)
Please take all new medication as prescribed - the cream, and the flomax for the prostate  Please call if not improved in 1 wk for Urology referral  Please continue all other medications as before, and refills have been done if requested.  Please have the pharmacy call with any other refills you may need.  Please continue your efforts at being more active, low cholesterol diet, and weight control.  You are otherwise up to date with prevention measures today.  Please keep your appointments with your specialists as you may have planned  Please go to the LAB in the Basement (turn left off the elevator) for the tests to be done today  You will be contacted by phone if any changes need to be made immediately.  Otherwise, you will receive a letter about your results with an explanation, but please check with MyChart first.  Please remember to sign up for MyChart if you have not done so, as this will be important to you in the future with finding out test results, communicating by private email, and scheduling acute appointments online when needed.  Please return in 1 year for your yearly visit, or sooner if needed, with Lab testing done 3-5 days before

## 2015-12-29 NOTE — Assessment & Plan Note (Signed)
Most c/w BPH symptoms, for flomax trial after UA results, consider urology and/or OAB tx

## 2015-12-29 NOTE — Progress Notes (Signed)
Pre visit review using our clinic review tool, if applicable. No additional management support is needed unless otherwise documented below in the visit note. 

## 2015-12-29 NOTE — Assessment & Plan Note (Signed)
stable overall by history and exam, recent data reviewed with pt, and pt to continue medical treatment as before,  to f/u any worsening symptoms or concerns Lab Results  Component Value Date   HGBA1C 5.7 12/29/2015

## 2015-12-29 NOTE — Assessment & Plan Note (Signed)

## 2015-12-29 NOTE — Progress Notes (Signed)
Subjective:    Patient ID: Louis Wheeler, male    DOB: 1955/11/25, 60 y.o.   MRN: [id number]  HPI  Here for wellness and f/u;  Overall doing ok;  Pt denies Chest pain, worsening SOB, DOE, wheezing, orthopnea, PND, worsening LE edema, palpitations, dizziness or syncope.  Pt denies neurological change such as new headache, facial or extremity weakness.  Pt denies polydipsia, polyuria, or low sugar symptoms. Pt states overall good compliance with treatment and medications, good tolerability, and has been trying to follow appropriate diet.  Pt denies worsening depressive symptoms, suicidal ideation or panic. No fever, night sweats, wt loss, loss of appetite, or other constitutional symptoms.  Pt states good ability with ADL's, has low fall risk, home safety reviewed and adequate, no other significant changes in hearing or vision, and only occasionally active with exercise.  Has unfortunately gained several lbs.  Pt has no other changes to wellness hx. Wt Readings from Last 3 Encounters:  12/29/15 254 lb (115.2 kg)  04/08/15 240 lb (108.9 kg)  02/15/15 240 lb (108.9 kg)   BP Readings from Last 3 Encounters:  12/29/15 130/72  04/08/15 133/70  02/15/15 114/60  Does have a significant itchy large area erythem scaly rash to natal cleft area,  tx as inflammatory per derm in past, did well with a cream he thinks was steroid. No other plaque or rash to torso or extremities.  This rash chronic, persistent, not really worsening, nothing else makes better or worse.  Also c/o 2-3 mo mild urinary flow symptoms with slower flow, frequency, occas urgency and post void dribbling.  Denies urinary symptoms such as dysuria, flank pain, hematuria or n/v, fever, chills. Declines referral to urology Past Medical History:  Diagnosis Date  . ALLERGIC RHINITIS 01/25/2007   Qualifier: Diagnosis of  By: Jenny Reichmann MD, Hunt Oris   . Barrett's esophagus   . ERECTILE DYSFUNCTION 01/25/2007   Qualifier: Diagnosis of  By: Jenny Reichmann MD, Hunt Oris    . Esophageal stricture   . HYPERLIPIDEMIA 01/25/2007   Qualifier: Diagnosis of  By: Jenny Reichmann MD, Hunt Oris   . Impaired glucose tolerance 01/25/2007   Qualifier: Diagnosis of  By: Jenny Reichmann MD, Hunt Oris   . 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 Peptic ulcer   . Tubular adenoma of colon 2007   Past Surgical History:  Procedure Laterality Date  . CHOLECYSTECTOMY    . COLONOSCOPY    . POLYPECTOMY    . SMALL INTESTINE SURGERY     "twisted intestines"    reports that he has quit smoking. He has never used smokeless tobacco. He reports that he drinks about 0.6 oz of alcohol per week . He reports that he does not use drugs. family history includes Leukemia in his brother. No Known Allergies Current Outpatient Prescriptions on File Prior to Visit  Medication Sig Dispense Refill  . aspirin EC 81 MG tablet Take 1 tablet (81 mg total) by mouth daily. (Patient taking differently: Take 81 mg by mouth as needed. ) 90 tablet 11  . omeprazole (PRILOSEC) 40 MG capsule Take 1 capsule (40 mg total) by mouth 2 (two) times daily. 30 capsule 11   No current facility-administered medications on file prior to visit.    Review of Systems   Constitutional: Negative for increased diaphoresis, or other activity, appetite or siginficant weight change other than noted HENT: Negative for worsening hearing loss, ear pain, facial swelling,  mouth sores and neck stiffness.   Eyes: Negative for other worsening pain, redness or visual disturbance.  Respiratory: Negative for choking or stridor Cardiovascular: Negative for other chest pain and palpitations.  Gastrointestinal: Negative for worsening diarrhea, blood in stool, or abdominal distention Genitourinary: Negative for hematuria, flank pain or change in urine volume.  Musculoskeletal: Negative for myalgias or other joint complaints.  Skin: Negative for other color change and wound or drainage.  Neurological: Negative for  syncope and numbness. other than noted Hematological: Negative for adenopathy. or other swelling Psychiatric/Behavioral: Negative for hallucinations, SI, self-injury, decreased concentration or other worsening agitation.  All o/w neg per pt after complete review    Objective:   Physical Exam BP 130/72   Pulse 76   Resp 20   Wt 254 lb (115.2 kg)   SpO2 97%   BMI 36.45 kg/m  VS noted,  Constitutional: Pt is oriented to person, place, and time. Appears well-developed and well-nourished, in no significant distress Head: Normocephalic and atraumatic  Eyes: Conjunctivae and EOM are normal. Pupils are equal, round, and reactive to light Right Ear: External ear normal.  Left Ear: External ear normal Nose: Nose normal.  Mouth/Throat: Oropharynx is clear and moist  Neck: Normal range of motion. Neck supple. No JVD present. No tracheal deviation present or significant neck LA or mass Cardiovascular: Normal rate, regular rhythm, normal heart sounds and intact distal pulses.   Pulmonary/Chest: Effort normal and breath sounds without rales or wheezing  Abdominal: Soft. Bowel sounds are normal. NT. No HSM  Musculoskeletal: Normal range of motion. Exhibits no edema Lymphadenopathy: Has no cervical adenopathy.  Neurological: Pt is alert and oriented to person, place, and time. Pt has normal reflexes. No cranial nerve deficit. Motor grossly intact Skin: Skin is warm and dry. No new ulcers, but has large area billateral upper buttocks and natal cleft scaly nontender erythema Psychiatric:  Has normal mood and affect. Behavior is normal.  No other significant physical findings.    Assessment & Plan:

## 2015-12-29 NOTE — Assessment & Plan Note (Signed)
C/w inflammatory, for steroid cream prn asd, consdier referral to derm/declines at this time

## 2015-12-30 ENCOUNTER — Encounter: Payer: Self-pay | Admitting: Internal Medicine

## 2015-12-30 LAB — HEPATITIS C ANTIBODY: HCV AB: NEGATIVE

## 2015-12-31 LAB — URINE CULTURE

## 2016-01-21 ENCOUNTER — Other Ambulatory Visit: Payer: Self-pay | Admitting: Internal Medicine

## 2016-02-09 ENCOUNTER — Encounter: Payer: 59 | Admitting: Internal Medicine

## 2016-02-29 ENCOUNTER — Other Ambulatory Visit: Payer: Self-pay | Admitting: *Deleted

## 2016-02-29 MED ORDER — TAMSULOSIN HCL 0.4 MG PO CAPS: 0.4000 mg | ORAL_CAPSULE | Freq: Every day | ORAL | 10 refills | Status: DC

## 2016-02-29 NOTE — Telephone Encounter (Signed)
Rec'd msg pt states he is needing refills on the Tamulosin. Inform pt per chart [facility], but will resend to the pharmacy again...Louis Wheeler

## 2016-03-01 ENCOUNTER — Other Ambulatory Visit: Payer: Self-pay | Admitting: Gastroenterology

## 2016-03-01 DIAGNOSIS — K209 Esophagitis, unspecified without bleeding: Secondary | ICD-10-CM

## 2016-03-01 DIAGNOSIS — D125 Benign neoplasm of sigmoid colon: Secondary | ICD-10-CM

## 2016-03-01 DIAGNOSIS — R1310 Dysphagia, unspecified: Secondary | ICD-10-CM

## 2016-04-07 ENCOUNTER — Other Ambulatory Visit: Payer: Self-pay | Admitting: Gastroenterology

## 2016-04-07 DIAGNOSIS — R1310 Dysphagia, unspecified: Secondary | ICD-10-CM

## 2016-04-07 DIAGNOSIS — K209 Esophagitis, unspecified without bleeding: Secondary | ICD-10-CM

## 2016-04-07 DIAGNOSIS — D125 Benign neoplasm of sigmoid colon: Secondary | ICD-10-CM

## 2016-04-26 DIAGNOSIS — J209 Acute bronchitis, unspecified: Secondary | ICD-10-CM | POA: Diagnosis not present

## 2016-05-11 ENCOUNTER — Other Ambulatory Visit: Payer: Self-pay | Admitting: Gastroenterology

## 2016-05-11 DIAGNOSIS — R1310 Dysphagia, unspecified: Secondary | ICD-10-CM

## 2016-05-11 DIAGNOSIS — K209 Esophagitis, unspecified without bleeding: Secondary | ICD-10-CM

## 2016-05-11 DIAGNOSIS — D125 Benign neoplasm of sigmoid colon: Secondary | ICD-10-CM

## 2016-07-12 DIAGNOSIS — J209 Acute bronchitis, unspecified: Secondary | ICD-10-CM | POA: Diagnosis not present

## 2016-11-14 ENCOUNTER — Encounter: Payer: Self-pay | Admitting: Internal Medicine

## 2016-11-14 ENCOUNTER — Ambulatory Visit (INDEPENDENT_AMBULATORY_CARE_PROVIDER_SITE_OTHER): Payer: BLUE CROSS/BLUE SHIELD | Admitting: Internal Medicine

## 2016-11-14 ENCOUNTER — Other Ambulatory Visit (INDEPENDENT_AMBULATORY_CARE_PROVIDER_SITE_OTHER): Payer: BLUE CROSS/BLUE SHIELD

## 2016-11-14 VITALS — BP 124/82 | HR 64 | Temp 97.9°F | Ht 70.0 in | Wt 248.0 lb

## 2016-11-14 DIAGNOSIS — R7302 Impaired glucose tolerance (oral): Secondary | ICD-10-CM | POA: Diagnosis not present

## 2016-11-14 DIAGNOSIS — Z0000 Encounter for general adult medical examination without abnormal findings: Secondary | ICD-10-CM | POA: Diagnosis not present

## 2016-11-14 DIAGNOSIS — Z114 Encounter for screening for human immunodeficiency virus [HIV]: Secondary | ICD-10-CM

## 2016-11-14 DIAGNOSIS — K209 Esophagitis, unspecified without bleeding: Secondary | ICD-10-CM

## 2016-11-14 DIAGNOSIS — Z0001 Encounter for general adult medical examination with abnormal findings: Secondary | ICD-10-CM

## 2016-11-14 LAB — CBC WITH DIFFERENTIAL/PLATELET
BASOS ABS: 0.1 10*3/uL (ref 0.0–0.1)
Basophils Relative: 0.7 % (ref 0.0–3.0)
EOS ABS: 0.3 10*3/uL (ref 0.0–0.7)
EOS PCT: 4.1 % (ref 0.0–5.0)
HCT: 45.4 % (ref 39.0–52.0)
HEMOGLOBIN: 15.1 g/dL (ref 13.0–17.0)
LYMPHS ABS: 2 10*3/uL (ref 0.7–4.0)
Lymphocytes Relative: 24.4 % (ref 12.0–46.0)
MCHC: 33.2 g/dL (ref 30.0–36.0)
MCV: 91.4 fl (ref 78.0–100.0)
Monocytes Absolute: 0.7 10*3/uL (ref 0.1–1.0)
Monocytes Relative: 9.2 % (ref 3.0–12.0)
NEUTROS PCT: 61.6 % (ref 43.0–77.0)
Neutro Abs: 5 10*3/uL (ref 1.4–7.7)
Platelets: 242 10*3/uL (ref 150.0–400.0)
RBC: 4.97 Mil/uL (ref 4.22–5.81)
RDW: 13.5 % (ref 11.5–15.5)
WBC: 8.1 10*3/uL (ref 4.0–10.5)

## 2016-11-14 LAB — BASIC METABOLIC PANEL
BUN: 13 mg/dL (ref 6–23)
CO2: 29 meq/L (ref 19–32)
Calcium: 9.5 mg/dL (ref 8.4–10.5)
Chloride: 106 mEq/L (ref 96–112)
Creatinine, Ser: 0.97 mg/dL (ref 0.40–1.50)
GFR: 82.99 mL/min (ref >60.00)
GLUCOSE: 99 mg/dL (ref 70–99)
POTASSIUM: 3.9 meq/L (ref 3.5–5.1)
Sodium: 141 mEq/L (ref 135–145)

## 2016-11-14 LAB — HEPATIC FUNCTION PANEL
ALBUMIN: 4.3 g/dL (ref 3.5–5.2)
ALK PHOS: 60 U/L (ref 39–117)
ALT: 18 U/L (ref 0–53)
AST: 16 U/L (ref 0–37)
Bilirubin, Direct: 0.2 mg/dL (ref 0.0–0.3)
TOTAL PROTEIN: 6.6 g/dL (ref 6.0–8.3)
Total Bilirubin: 0.8 mg/dL (ref 0.2–1.2)

## 2016-11-14 LAB — URINALYSIS, ROUTINE W REFLEX MICROSCOPIC
BILIRUBIN URINE: NEGATIVE
Ketones, ur: NEGATIVE
LEUKOCYTES UA: NEGATIVE
NITRITE: NEGATIVE
PH: 5.5 (ref 5.0–8.0)
Specific Gravity, Urine: >=1.03 — AB (ref 1.000–1.030)
URINE GLUCOSE: NEGATIVE
Urobilinogen, UA: 0.2 (ref 0.0–1.0)

## 2016-11-14 LAB — PSA: PSA: 0.72 ng/mL (ref 0.10–4.00)

## 2016-11-14 LAB — LIPID PANEL
CHOLESTEROL: 130 mg/dL (ref 0–200)
HDL: 39.2 mg/dL (ref >39.00)
LDL CALC: 75 mg/dL (ref 0–99)
NonHDL: 90.64
TRIGLYCERIDES: 76 mg/dL (ref 0.0–149.0)
Total CHOL/HDL Ratio: 3
VLDL: 15.2 mg/dL (ref 0.0–40.0)

## 2016-11-14 LAB — TSH: TSH: 1.12 u[IU]/mL (ref 0.35–4.50)

## 2016-11-14 LAB — HEMOGLOBIN A1C: HEMOGLOBIN A1C: 5.7 % (ref 4.6–6.5)

## 2016-11-14 MED ORDER — OMEPRAZOLE 40 MG PO CPDR: 40.0000 mg | DELAYED_RELEASE_CAPSULE | Freq: Two times a day (BID) | ORAL | 3 refills | Status: AC

## 2016-11-14 MED ORDER — TAMSULOSIN HCL 0.4 MG PO CAPS: 0.4000 mg | ORAL_CAPSULE | Freq: Every day | ORAL | 3 refills | Status: DC

## 2016-11-14 NOTE — Patient Instructions (Signed)
Please continue all other medications as before, and refills have been done if requested.  Please have the pharmacy call with any other refills you may need.  Please continue your efforts at being more active, low cholesterol diet, and weight control.  You are otherwise up to date with prevention measures today.  Please keep your appointments with your specialists as you may have planned  Your form was filled out today  Please go to the LAB in the Basement (turn left off the elevator) for the tests to be done today  You will be contacted by phone if any changes need to be made immediately.  Otherwise, you will receive a letter about your results with an explanation, but please check with MyChart first.  Please remember to sign up for MyChart if you have not done so, as this will be important to you in the future with finding out test results, communicating by private email, and scheduling acute appointments online when needed.  Please return in 1 year for your yearly visit, or sooner if needed, with Lab testing done 3-5 days before

## 2016-11-14 NOTE — Progress Notes (Signed)
Subjective:    Patient ID: Louis Wheeler, male    DOB: 1956-02-13, 61 y.o.   MRN: [id number]  HPI  Here for wellness and f/u;  Overall doing ok;  Pt denies Chest pain, worsening SOB, DOE, wheezing, orthopnea, PND, worsening LE edema, palpitations, dizziness or syncope.  Pt denies neurological change such as new headache, facial or extremity weakness.  Pt denies polydipsia, polyuria, or low sugar symptoms. Pt states overall good compliance with treatment and medications, good tolerability, and has been trying to follow appropriate diet.  Pt denies worsening depressive symptoms, suicidal ideation or panic. No fever, night sweats, wt loss, loss of appetite, or other constitutional symptoms.  Pt states good ability with ADL's, has low fall risk, home safety reviewed and adequate, no other significant changes in hearing or vision, and only occasionally active with exercise. No other hx interval change Past Medical History:  Diagnosis Date  . ALLERGIC RHINITIS 01/25/2007   Qualifier: Diagnosis of  By: Jenny Reichmann MD, Hunt Oris   . Barrett's esophagus   . ERECTILE DYSFUNCTION 01/25/2007   Qualifier: Diagnosis of  By: Jenny Reichmann MD, Hunt Oris   . Esophageal stricture   . HYPERLIPIDEMIA 01/25/2007   Qualifier: Diagnosis of  By: Jenny Reichmann MD, Hunt Oris   . Impaired glucose tolerance 01/25/2007   Qualifier: Diagnosis of  By: Jenny Reichmann MD, Hunt Oris   . 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 Peptic ulcer   . Tubular adenoma of colon 2007   Past Surgical History:  Procedure Laterality Date  . CHOLECYSTECTOMY    . COLONOSCOPY    . POLYPECTOMY    . SMALL INTESTINE SURGERY     "twisted intestines"    reports that he has quit smoking. He has never used smokeless tobacco. He reports that he drinks about 0.6 oz of alcohol per week . He reports that he does not use drugs. family history includes Hypertension in his unknown relative; Leukemia in his brother. No Known Allergies Current  Outpatient Prescriptions on File Prior to Visit  Medication Sig Dispense Refill  . aspirin EC 81 MG tablet Take 1 tablet (81 mg total) by mouth daily. (Patient taking differently: Take 81 mg by mouth as needed. ) 90 tablet 11  . Fluocinonide Emulsified Base 0.05 % CREA APPLY TOPICALLY TWICE A DAY AS DIRECTED. 60 g 0   No current facility-administered medications on file prior to visit.    Review of Systems Constitutional: Negative for other unusual diaphoresis, sweats, appetite or weight changes HENT: Negative for other worsening hearing loss, ear pain, facial swelling, mouth sores or neck stiffness.   Eyes: Negative for other worsening pain, redness or other visual disturbance.  Respiratory: Negative for other stridor or swelling Cardiovascular: Negative for other palpitations or other chest pain  Gastrointestinal: Negative for worsening diarrhea or loose stools, blood in stool, distention or other pain Genitourinary: Negative for hematuria, flank pain or other change in urine volume.  Musculoskeletal: Negative for myalgias or other joint swelling.  Skin: Negative for other color change, or other wound or worsening drainage.  Neurological: Negative for other syncope or numbness. Hematological: Negative for other adenopathy or swelling Psychiatric/Behavioral: Negative for hallucinations, other worsening agitation, SI, self-injury, or new decreased concentration All other system neg per pt    Objective:   Physical Exam BP 124/82   Pulse 64   Temp 97.9 F (36.6 C) (Oral)   Ht 5\' 10"  (1.778 m)  Wt 248 lb (112.5 kg)   SpO2 97%   BMI 35.58 kg/m  VS noted,  Constitutional: Pt appears in NAD HENT: Head: NCAT.  Right Ear: External ear normal.  Left Ear: External ear normal.  Eyes: . Pupils are equal, round, and reactive to light. Conjunctivae and EOM are normal Nose: without d/c or deformity Neck: Neck supple. Gross normal ROM Cardiovascular: Normal rate and regular rhythm.     Pulmonary/Chest: Effort normal and breath sounds without rales or wheezing.  Abd:  Soft, NT, ND, + BS, no organomegaly Neurological: Pt is alert. At baseline orientation, motor grossly intact Skin: Skin is warm. No rashes, other new lesions, no LE edema Psychiatric: Pt behavior is normal without agitation  No other exam findings  Lab Results  Component Value Date   WBC 8.1 11/14/2016   HGB 15.1 11/14/2016   HCT 45.4 11/14/2016   PLT 242.0 11/14/2016   GLUCOSE 99 11/14/2016   CHOL 130 11/14/2016   TRIG 76.0 11/14/2016   HDL 39.20 11/14/2016   LDLCALC 75 11/14/2016   ALT 18 11/14/2016   AST 16 11/14/2016   NA 141 11/14/2016   K 3.9 11/14/2016   CL 106 11/14/2016   CREATININE 0.97 11/14/2016   BUN 13 11/14/2016   CO2 29 11/14/2016   TSH 1.12 11/14/2016   PSA 0.72 11/14/2016   HGBA1C 5.7 11/14/2016         Assessment & Plan:

## 2016-11-18 NOTE — Assessment & Plan Note (Signed)

## 2016-11-18 NOTE — Assessment & Plan Note (Signed)
Lab Results  Component Value Date   HGBA1C 5.7 11/14/2016  stable overall by history and exam, recent data reviewed with pt, and pt to continue medical treatment as before,  to f/u any worsening symptoms or concerns

## 2016-11-30 DIAGNOSIS — J0100 Acute maxillary sinusitis, unspecified: Secondary | ICD-10-CM | POA: Diagnosis not present

## 2016-11-30 DIAGNOSIS — J209 Acute bronchitis, unspecified: Secondary | ICD-10-CM | POA: Diagnosis not present

## 2016-11-30 DIAGNOSIS — R05 Cough: Secondary | ICD-10-CM | POA: Diagnosis not present

## 2016-12-28 DIAGNOSIS — H00015 Hordeolum externum left lower eyelid: Secondary | ICD-10-CM | POA: Diagnosis not present

## 2017-01-03 ENCOUNTER — Encounter: Payer: Self-pay | Admitting: Internal Medicine

## 2017-01-03 ENCOUNTER — Ambulatory Visit (INDEPENDENT_AMBULATORY_CARE_PROVIDER_SITE_OTHER): Payer: BLUE CROSS/BLUE SHIELD | Admitting: Internal Medicine

## 2017-01-03 VITALS — BP 128/86 | HR 71 | Temp 97.8°F | Ht 70.0 in | Wt 250.0 lb

## 2017-01-03 DIAGNOSIS — R7302 Impaired glucose tolerance (oral): Secondary | ICD-10-CM | POA: Diagnosis not present

## 2017-01-03 DIAGNOSIS — H0015 Chalazion left lower eyelid: Secondary | ICD-10-CM

## 2017-01-03 DIAGNOSIS — Z23 Encounter for immunization: Secondary | ICD-10-CM | POA: Diagnosis not present

## 2017-01-03 DIAGNOSIS — Z021 Encounter for pre-employment examination: Secondary | ICD-10-CM | POA: Diagnosis not present

## 2017-01-03 NOTE — Progress Notes (Signed)
Subjective:    Patient ID: Louis Wheeler, male    DOB: Mar 30, 1955, 61 y.o.   MRN: [id number]  HPI  Here with 1 wk onset left lower eyelid stye that turned into a mild swelling of the lower eyelid without fever, drainage, or worsening pain, nothing seems to make better or worse. Pt denies chest pain, increased sob or doe, wheezing, orthopnea, PND, increased LE swelling, palpitations, dizziness or syncope.  Pt denies new neurological symptoms such as new headache, or facial or extremity weakness or numbness   Pt denies polydipsia, polyuria.  Needs form filled out for employment. Past Medical History:  Diagnosis Date  . ALLERGIC RHINITIS 01/25/2007   Qualifier: Diagnosis of  By: Jenny Reichmann MD, Hunt Oris   . Barrett's esophagus   . ERECTILE DYSFUNCTION 01/25/2007   Qualifier: Diagnosis of  By: Jenny Reichmann MD, Hunt Oris   . Esophageal stricture   . HYPERLIPIDEMIA 01/25/2007   Qualifier: Diagnosis of  By: Jenny Reichmann MD, Hunt Oris   . Impaired glucose tolerance 01/25/2007   Qualifier: Diagnosis of  By: Jenny Reichmann MD, Hunt Oris   . 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 Peptic ulcer   . Tubular adenoma of colon 2007   Past Surgical History:  Procedure Laterality Date  . CHOLECYSTECTOMY    . COLONOSCOPY    . POLYPECTOMY    . SMALL INTESTINE SURGERY     "twisted intestines"    reports that he has quit smoking. He has never used smokeless tobacco. He reports that he drinks about 0.6 oz of alcohol per week . He reports that he does not use drugs. family history includes Hypertension in his unknown relative; Leukemia in his brother. No Known Allergies Current Outpatient Prescriptions on File Prior to Visit  Medication Sig Dispense Refill  . aspirin EC 81 MG tablet Take 1 tablet (81 mg total) by mouth daily. (Patient taking differently: Take 81 mg by mouth as needed. ) 90 tablet 11  . Fluocinonide Emulsified Base 0.05 % CREA APPLY TOPICALLY TWICE A DAY AS DIRECTED. 60 g 0  .  omeprazole (PRILOSEC) 40 MG capsule Take 1 capsule (40 mg total) by mouth 2 (two) times daily. 180 capsule 3  . tamsulosin (FLOMAX) 0.4 MG CAPS capsule Take 1 capsule (0.4 mg total) by mouth daily. 90 capsule 3   No current facility-administered medications on file prior to visit.    Review of Systems  Constitutional: Negative for other unusual diaphoresis or sweats HENT: Negative for ear discharge or swelling Eyes: Negative for other worsening visual disturbances Respiratory: Negative for stridor or other swelling  Gastrointestinal: Negative for worsening distension or other blood Genitourinary: Negative for retention or other urinary change Musculoskeletal: Negative for other MSK pain or swelling Skin: Negative for color change or other new lesions Neurological: Negative for worsening tremors and other numbness  Psychiatric/Behavioral: Negative for worsening agitation or other fatigue All other system neg per pt    Objective:   Physical Exam BP 128/86   Pulse 71   Temp 97.8 F (36.6 C) (Oral)   Ht 5\' 10"  (1.778 m)   Wt 250 lb (113.4 kg)   SpO2 98%   BMI 35.87 kg/m  VS noted,  Constitutional: Pt appears in NAD HENT: Head: NCAT.  Right Ear: External ear normal.  Left Ear: External ear normal.  Eyes: . Pupils are equal, round, and reactive to light. Conjunctivae and EOM are normal, left lower  eyelid with 8 mm raised cystic lesion NT without ulcer or drainage,  Nose: without d/c or deformity Neck: Neck supple. Gross normal ROM Cardiovascular: Normal rate and regular rhythm.   Pulmonary/Chest: Effort normal and breath sounds without rales or wheezing.  Abd:  Soft, NT, ND, + BS, no organomegaly Neurological: Pt is alert. At baseline orientation, motor grossly intact Skin: Skin is warm. No rashes, other new lesions, no LE edema Psychiatric: Pt behavior is normal without agitation  No other exam findings  Lab Results  Component Value Date   WBC 8.1 11/14/2016   HGB 15.1  11/14/2016   HCT 45.4 11/14/2016   PLT 242.0 11/14/2016   GLUCOSE 99 11/14/2016   CHOL 130 11/14/2016   TRIG 76.0 11/14/2016   HDL 39.20 11/14/2016   LDLCALC 75 11/14/2016   ALT 18 11/14/2016   AST 16 11/14/2016   NA 141 11/14/2016   K 3.9 11/14/2016   CL 106 11/14/2016   CREATININE 0.97 11/14/2016   BUN 13 11/14/2016   CO2 29 11/14/2016   TSH 1.12 11/14/2016   PSA 0.72 11/14/2016   HGBA1C 5.7 11/14/2016       Assessment & Plan:

## 2017-01-03 NOTE — Patient Instructions (Signed)
Please call if you would want a referral for the left lower eyelid chalazion (cyst) to opthalmology  Please continue all other medications as before, and refills have been done if requested.  Please have the pharmacy call with any other refills you may need  Please keep your appointments with your specialists as you may have planned  Your form was filled out today

## 2017-01-06 DIAGNOSIS — H0015 Chalazion left lower eyelid: Secondary | ICD-10-CM | POA: Insufficient documentation

## 2017-01-06 DIAGNOSIS — Z021 Encounter for pre-employment examination: Secondary | ICD-10-CM | POA: Insufficient documentation

## 2017-01-06 NOTE — Assessment & Plan Note (Signed)
Form signed and filled out,  to f/u any worsening symptoms or concerns

## 2017-01-06 NOTE — Assessment & Plan Note (Signed)
stable overall by history and exam, recent data reviewed with pt, and pt to continue medical treatment as before,  to f/u any worsening symptoms or concerns Lab Results  Component Value Date   HGBA1C 5.7 11/14/2016

## 2017-01-06 NOTE — Assessment & Plan Note (Signed)
Mild without s/s of infection, d/w pt benign nature, pt declines optho referral for now

## 2017-03-01 ENCOUNTER — Other Ambulatory Visit: Payer: Self-pay | Admitting: Internal Medicine

## 2017-03-08 ENCOUNTER — Telehealth: Payer: Self-pay | Admitting: Internal Medicine

## 2017-03-08 NOTE — Telephone Encounter (Signed)
Ok to take in the AM, once per day (flomax)

## 2017-03-08 NOTE — Telephone Encounter (Signed)
Called pt, left detailed msg with instructions on how to take.

## 2017-03-08 NOTE — Telephone Encounter (Signed)
Patient requesting call on how to take prostate medication.

## 2017-05-03 ENCOUNTER — Telehealth: Payer: Self-pay | Admitting: Internal Medicine

## 2017-05-03 MED ORDER — SOLIFENACIN SUCCINATE 5 MG PO TABS: 5.0000 mg | ORAL_TABLET | Freq: Every day | ORAL | 0 refills | Status: AC

## 2017-05-03 NOTE — Telephone Encounter (Signed)
Copied from [place]. Topic: General - Other >> May 03, 2017  9:23 AM Carolyn Stare wrote:  Pt call to say the below med is not working. He said he keeps urinating on himself and is asking if something else can be called in.    [phone]    tamsulosin (FLOMAX) 0.4 MG CAPS capsule  Pharmacy [facility]  [address]

## 2017-05-03 NOTE — Telephone Encounter (Addendum)
I would prefer for pt to take more than one dose to assess the efficacy, as he was just started yesterday.  I am not sure he was really taking before that.  Please call on Monday if not improving, as we could consider change to med like vesicare for OAB, or refer urology if he likes.  He should be seen in office if having pain, unusual odor, or fever.

## 2017-05-03 NOTE — Telephone Encounter (Signed)
The patient stated that he has been taking this medication for about 4-5 months now and has not seen any improvement. Please advise.

## 2017-05-03 NOTE — Addendum Note (Signed)
Addended by: Biagio Borg on: 05/03/2017 05:07 PM   Modules accepted: Orders

## 2017-05-03 NOTE — Telephone Encounter (Signed)
Ok to stop the flomax  Please take all new medication as prescribed - the vesicare 5 mg per day  Please have pharmacy call us with a similar med that is covered under his insurance plan if the vesicare is not covered

## 2017-05-04 ENCOUNTER — Telehealth: Payer: Self-pay | Admitting: Internal Medicine

## 2017-05-04 NOTE — Telephone Encounter (Signed)
Informed pt that unfortunately if that is the only alternative that they cover its nothing else that we could send in that would be cheaper. He asked if the script that was sent in was for 30 or 90 days. I checked and it was for a 90 day supply. He stated that the $130 was not bad for 3 months since he was thinking that it was only for 30 days. He decided to try the vesicare and do the 90 days supply that was sent in.

## 2017-05-04 NOTE — Telephone Encounter (Signed)
Copied from [place] [phone]. Topic: Quick Communication - See Telephone Encounter >> May 04, 2017  8:58 AM Ether Griffins B wrote: CRM for notification. See Telephone encounter for:  Pts insurance will cover solifenacin but it will still cost him $130.00. He is wanting to know if something cheaper can be called in. And asking to be notified once it has been as he lives 30 minutes away from the pharmacy.  05/04/17.

## 2017-05-04 NOTE — Telephone Encounter (Signed)
Pt has been informed.

## 2018-05-07 ENCOUNTER — Encounter: Payer: Self-pay | Admitting: Gastroenterology

## 2018-09-04 ENCOUNTER — Telehealth: Payer: Self-pay | Admitting: Internal Medicine

## 2018-09-04 NOTE — Telephone Encounter (Signed)
I think you mean a fax to request recent PSA level that was done here  [facility] to fax back if this is requested

## 2018-09-04 NOTE — Telephone Encounter (Signed)
Noted.  Dr. Viyan FYI 

## 2018-09-04 NOTE — Telephone Encounter (Signed)
Copied from [place] [phone]. Topic: General - Other >> Sep 04, 2018 10:32 AM Celene Kras A wrote: Reason for CRM: Maudie Mercury, from [facility], called stating she is sending fax over to receive TSA levels for pt on 09/04/2018. Please advise.

## 2018-09-05 NOTE — Telephone Encounter (Signed)
PSA request received and has been faxed back

## 2019-11-26 ENCOUNTER — Encounter: Payer: Self-pay | Admitting: Gastroenterology

## 2020-04-14 ENCOUNTER — Encounter: Payer: Self-pay | Admitting: Gastroenterology
